# Patient Record
Sex: Female | Born: 1955 | Race: White | Hispanic: No | Marital: Single | Smoking: Never smoker
Health system: Southern US, Community
[De-identification: ages and names within clinical notes are randomized; demographics above are authoritative.]

## PROBLEM LIST (undated history)

## (undated) DIAGNOSIS — N200 Calculus of kidney: Secondary | ICD-10-CM

## (undated) DIAGNOSIS — Z923 Personal history of irradiation: Secondary | ICD-10-CM

## (undated) DIAGNOSIS — E7800 Pure hypercholesterolemia, unspecified: Secondary | ICD-10-CM

## (undated) DIAGNOSIS — I82409 Acute embolism and thrombosis of unspecified deep veins of unspecified lower extremity: Secondary | ICD-10-CM

## (undated) DIAGNOSIS — Z9221 Personal history of antineoplastic chemotherapy: Secondary | ICD-10-CM

## (undated) DIAGNOSIS — I10 Essential (primary) hypertension: Secondary | ICD-10-CM

## (undated) DIAGNOSIS — I2699 Other pulmonary embolism without acute cor pulmonale: Secondary | ICD-10-CM

## (undated) DIAGNOSIS — E119 Type 2 diabetes mellitus without complications: Secondary | ICD-10-CM

## (undated) DIAGNOSIS — C50919 Malignant neoplasm of unspecified site of unspecified female breast: Secondary | ICD-10-CM

## (undated) HISTORY — PX: MASTECTOMY: SHX3

---

## 2003-10-01 HISTORY — PX: BREAST SURGERY: SHX581

## 2005-06-10 ENCOUNTER — Encounter: Admission: RE | Admit: 2005-06-10 | Discharge: 2005-06-10 | Payer: Self-pay | Admitting: Hematology and Oncology

## 2005-06-13 ENCOUNTER — Encounter: Admission: RE | Admit: 2005-06-13 | Discharge: 2005-06-13 | Payer: Self-pay | Admitting: Hematology and Oncology

## 2005-06-13 ENCOUNTER — Encounter (INDEPENDENT_AMBULATORY_CARE_PROVIDER_SITE_OTHER): Payer: Self-pay | Admitting: Specialist

## 2005-12-15 ENCOUNTER — Encounter: Admission: RE | Admit: 2005-12-15 | Discharge: 2005-12-15 | Payer: Self-pay | Admitting: Hematology and Oncology

## 2005-12-19 ENCOUNTER — Encounter: Admission: RE | Admit: 2005-12-19 | Discharge: 2005-12-19 | Payer: Self-pay | Admitting: Hematology and Oncology

## 2006-06-19 ENCOUNTER — Encounter: Admission: RE | Admit: 2006-06-19 | Discharge: 2006-06-19 | Payer: Self-pay | Admitting: Hematology and Oncology

## 2006-06-23 ENCOUNTER — Encounter: Admission: RE | Admit: 2006-06-23 | Discharge: 2006-06-23 | Payer: Self-pay | Admitting: Hematology and Oncology

## 2006-12-11 ENCOUNTER — Encounter: Admission: RE | Admit: 2006-12-11 | Discharge: 2006-12-11 | Payer: Self-pay | Admitting: General Surgery

## 2006-12-30 ENCOUNTER — Encounter (INDEPENDENT_AMBULATORY_CARE_PROVIDER_SITE_OTHER): Payer: Self-pay | Admitting: Specialist

## 2006-12-30 ENCOUNTER — Inpatient Hospital Stay (HOSPITAL_COMMUNITY): Admission: RE | Admit: 2006-12-30 | Discharge: 2007-01-02 | Payer: Self-pay | Admitting: General Surgery

## 2007-02-20 ENCOUNTER — Inpatient Hospital Stay (HOSPITAL_COMMUNITY): Admission: AD | Admit: 2007-02-20 | Discharge: 2007-02-25 | Payer: Self-pay | Admitting: Plastic Surgery

## 2007-12-14 ENCOUNTER — Encounter: Admission: RE | Admit: 2007-12-14 | Discharge: 2007-12-14 | Payer: Self-pay | Admitting: Hematology and Oncology

## 2008-06-13 IMAGING — CR DG CHEST 2V
2 series · 2 of 2 positions shown · non-contrast
Comparison: None.

CLINICAL DATA: Breast cancer.  Preop radiograph.  SOB. 
 CHEST ? 2 VIEW:

[view not recorded (1 of 2)]
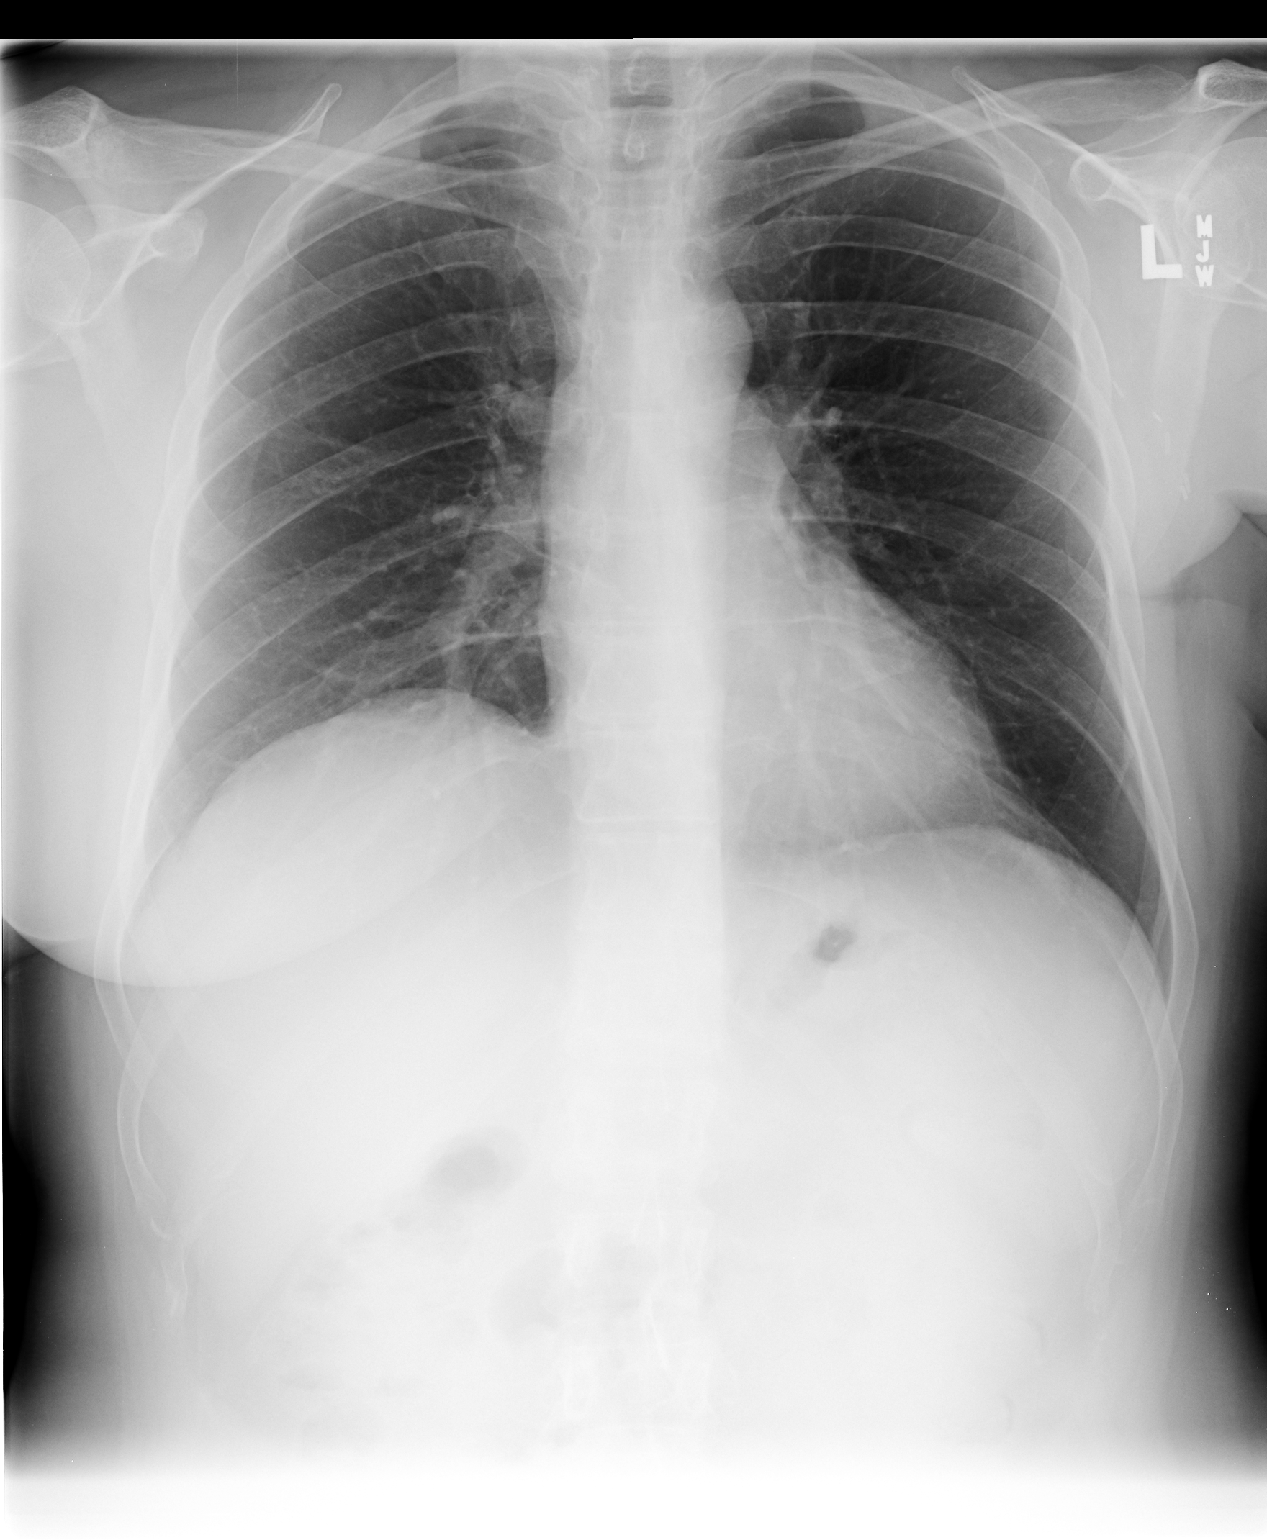

[view not recorded (2 of 2)]
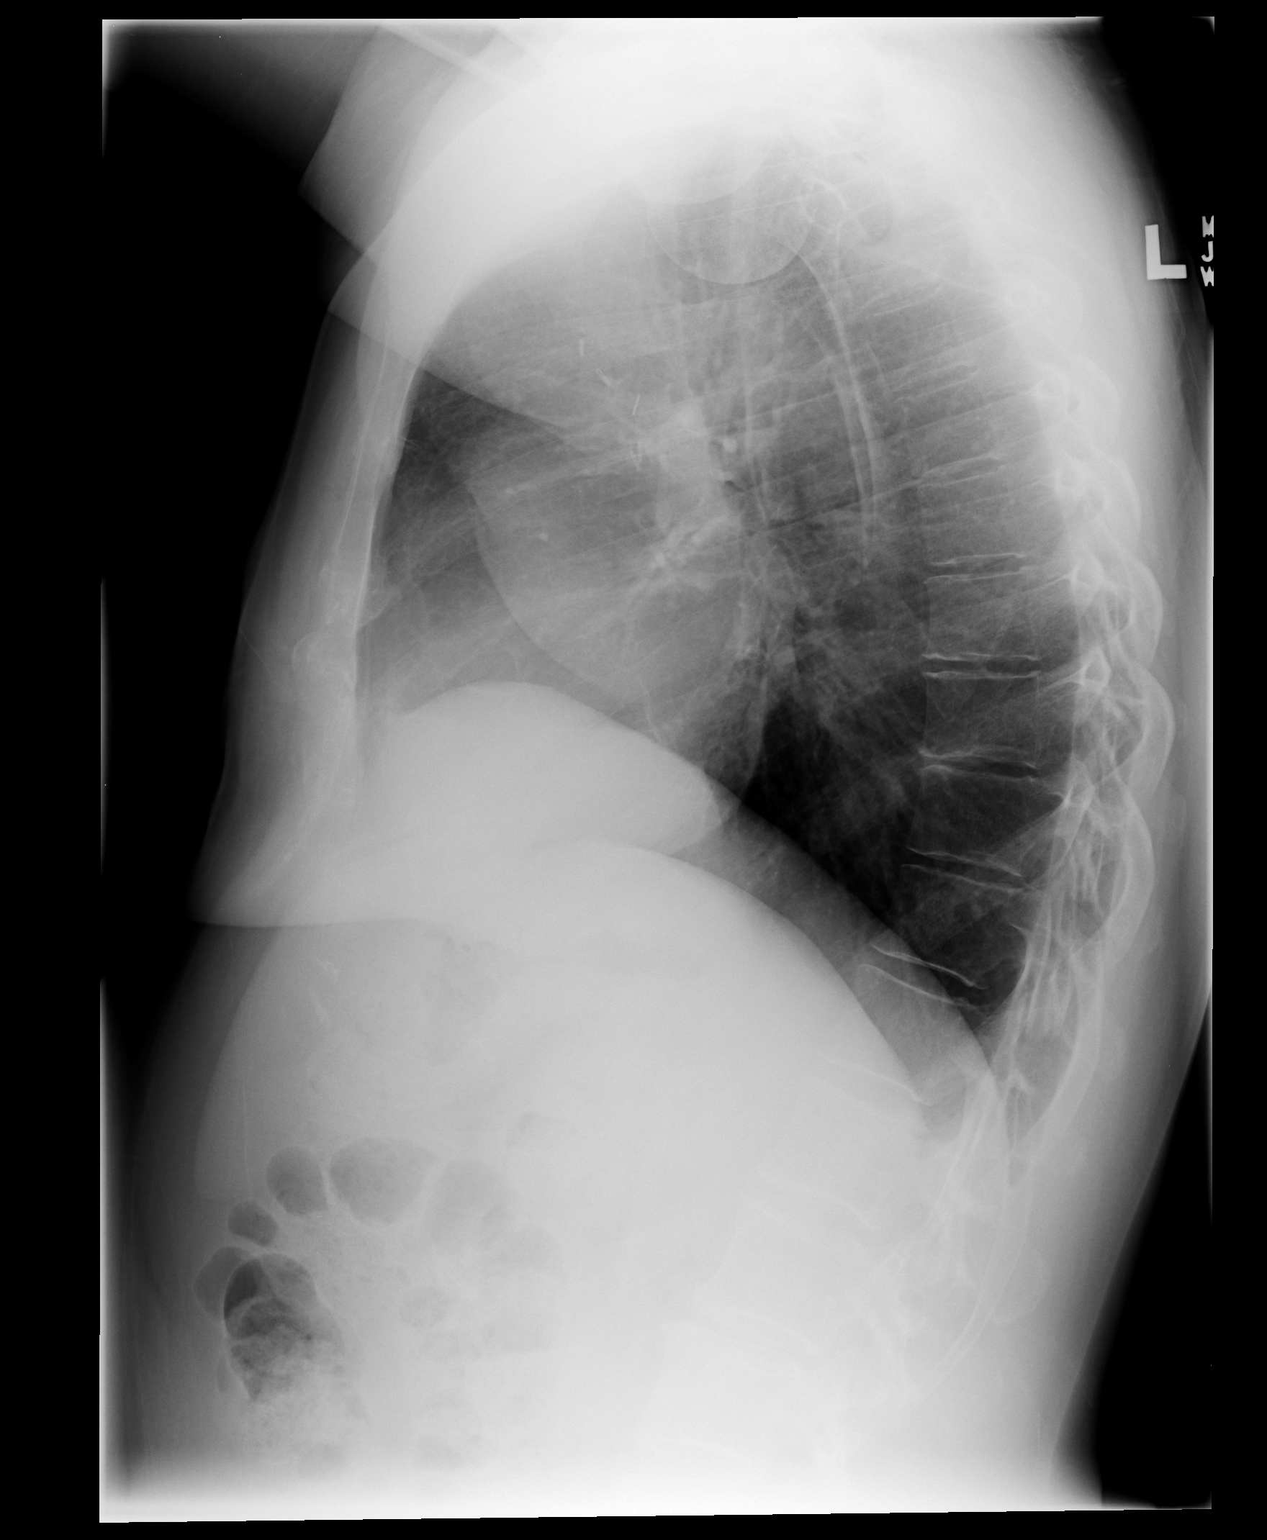

[2 of 2 positions shown; findings below may reference images not displayed]

FINDINGS: Heart size is normal.  There are no effusions or edema.  No airspace opacities are identified.
IMPRESSION: No active disease.

## 2008-08-10 IMAGING — CR DG ABDOMEN 1V
1 series · 1 of 1 positions shown · non-contrast
Comparison: None.

Exam: abdomen, one view.

HISTORY: Redness over abdomen. Status post tram flap 8 weeks ago.

[t abdomen supine]
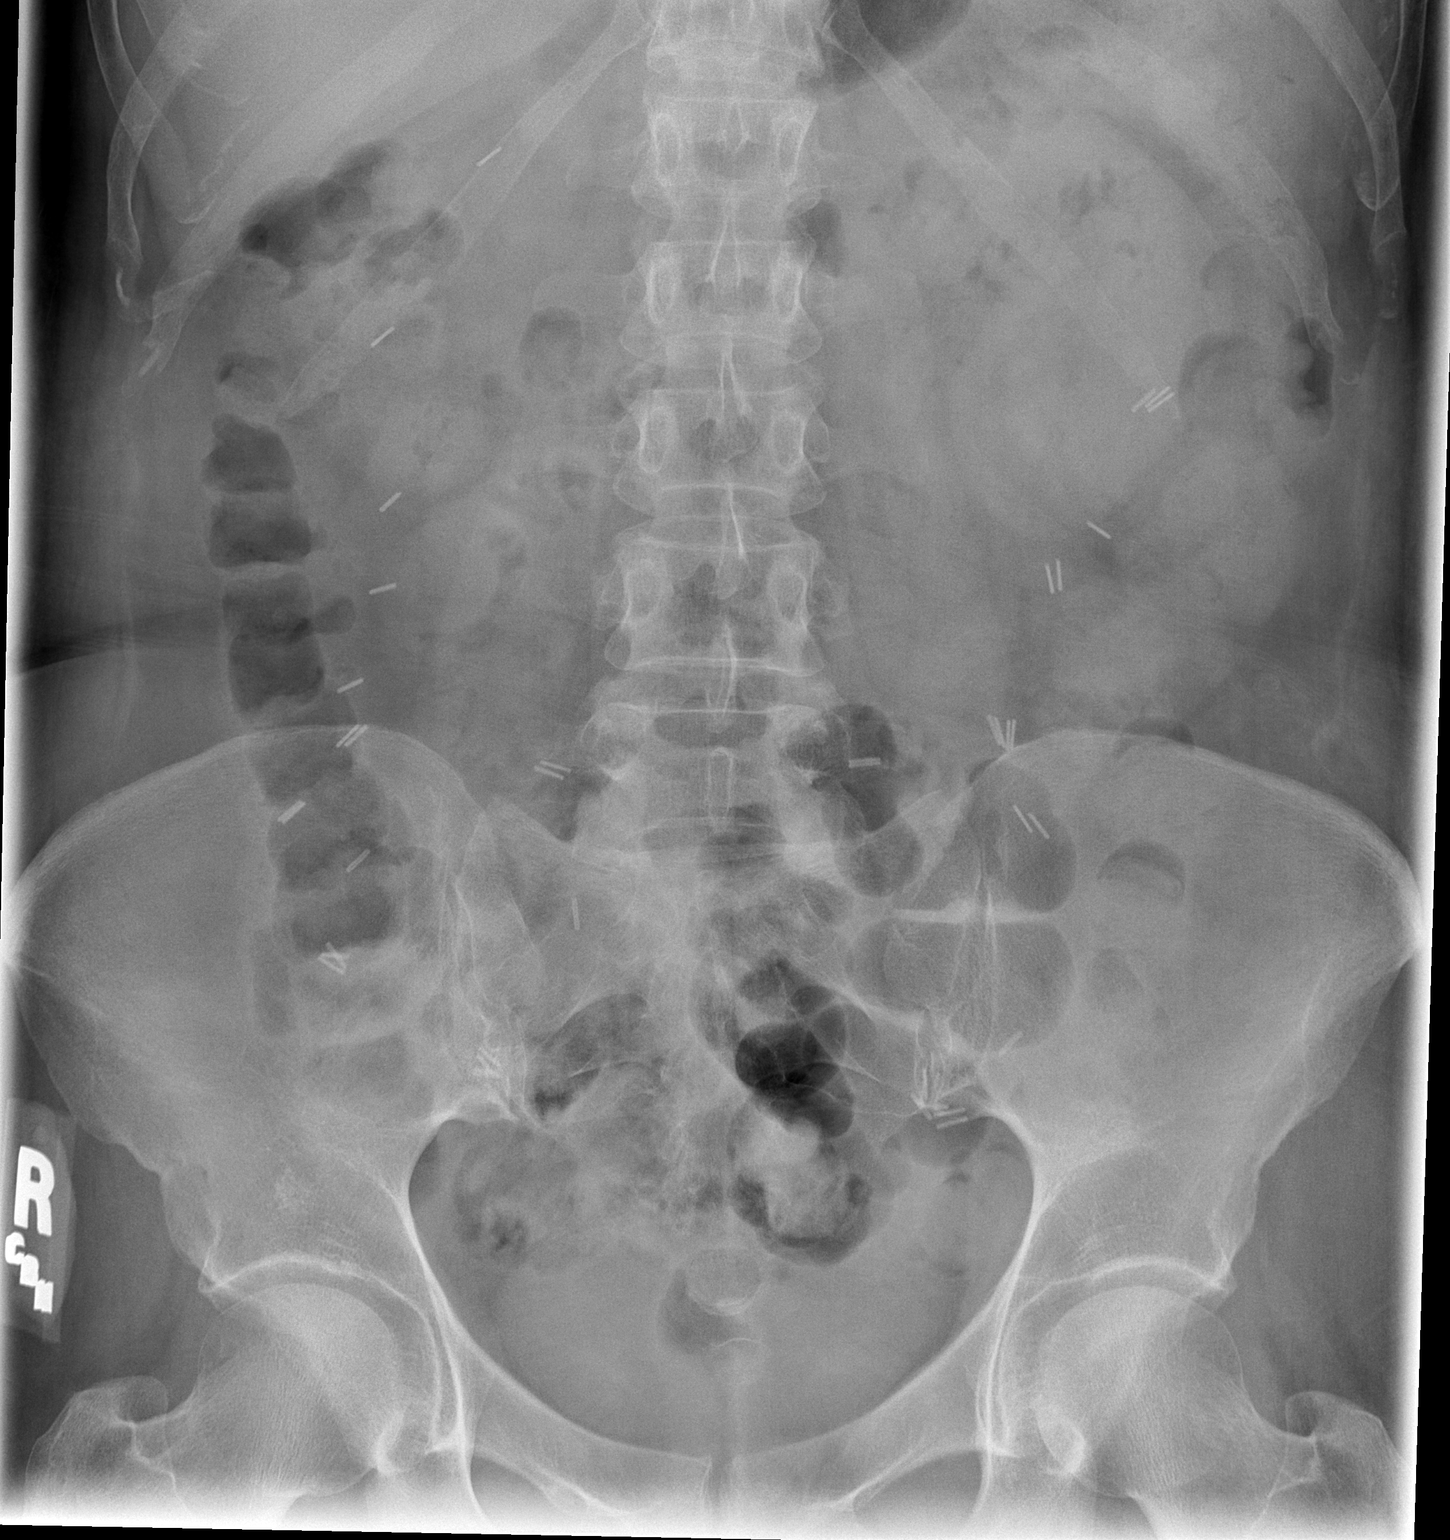

[1 of 1 positions shown; findings below may reference images not displayed]

FINDINGS: Bowel gas pattern is normal. 

There are surgical staples in the projection of both hemiabdomen.

No evidence for bowel obstruction.
IMPRESSION: 1. Normal bowel gas pattern.

## 2008-12-14 ENCOUNTER — Encounter: Admission: RE | Admit: 2008-12-14 | Discharge: 2008-12-14 | Payer: Self-pay | Admitting: Hematology and Oncology

## 2010-01-09 ENCOUNTER — Encounter: Admission: RE | Admit: 2010-01-09 | Discharge: 2010-01-09 | Payer: Self-pay | Admitting: Hematology and Oncology

## 2010-12-10 ENCOUNTER — Other Ambulatory Visit: Payer: Self-pay | Admitting: Hematology and Oncology

## 2010-12-10 DIAGNOSIS — Z853 Personal history of malignant neoplasm of breast: Secondary | ICD-10-CM

## 2010-12-10 DIAGNOSIS — Z1231 Encounter for screening mammogram for malignant neoplasm of breast: Secondary | ICD-10-CM

## 2011-01-24 ENCOUNTER — Ambulatory Visit
Admission: RE | Admit: 2011-01-24 | Discharge: 2011-01-24 | Disposition: A | Discharge status: Home / Self Care | Payer: 59 | Source: Ambulatory Visit | Attending: Hematology and Oncology | Admitting: Hematology and Oncology

## 2011-01-24 DIAGNOSIS — Z853 Personal history of malignant neoplasm of breast: Secondary | ICD-10-CM

## 2011-01-24 DIAGNOSIS — Z1231 Encounter for screening mammogram for malignant neoplasm of breast: Secondary | ICD-10-CM

## 2011-02-12 NOTE — Discharge Summary (Signed)
NAMEMEKLIT, COTTA             ACCOUNT NO.:  [id number]   MEDICAL RECORD NO.:  [id number]          PATIENT TYPE:  INP   LOCATION:  5729                         FACILITY:  [facility]   PHYSICIAN:  Etter Sjogren, M.D.     DATE OF BIRTH:  02-21-1956   DATE OF ADMISSION:  02/20/2007  DATE OF DISCHARGE:  02/25/2007                               DISCHARGE SUMMARY   FINAL DIAGNOSIS:  1. Status post breast cancer with bilaterally breast reconstructions      using TRAM flaps.  2. An episode of cellulitis.   PROCEDURES PERFORMED:  None.   HISTORY:  A 55 year old woman who has had breast cancer, has had  bilateral TRAM flap reconstruction, and did well for about 8 weeks.  Then did have an episode of erythema that responded to antibiotics,  about 6 weeks ago.  She developed a recurrence of that erythema in the  upper abdomen, but it did spread up toward the right breast  reconstruction, and she was hospitalized for treatment.  For further  details of this, please refer to the chart.   COURSE IN THE HOSPITAL:  On admission her white blood cell count was  8300.  Her temperature was 100.9.  That was the maximum temperature.  She immediately deffervesced after admission to the hospital.  Chest x-  ray was normal.  KUB of the abdomen was normal.  Blood cultures were  negative for any growth.  She remained on IV Ancef for 5 days.  Ultrasound to check for free fluid in the subcutaneous space was  negative.  I did not notice any free fluid.  Erythema was completely  resolved.  It was felt that she was ready to be discharged.  She  remained afebrile since her initial admission.   DISPOSITION:  She is discharged on Keflex 500 mg p.o. q.i.d.  She will  continue her other medications as previous.  She continues to have  small, open wounds in the abdomen, and these are treated with saline  dressings.  Told to follow up in the office in 5 days for recheck.      Etter Sjogren, M.D.  Electronically  Signed     DB/MEDQ  D:  02/25/2007  T:  02/26/2007  Job:  [id number]

## 2011-02-15 NOTE — H&P (Signed)
Sherry Schultz, Sherry Schultz             ACCOUNT NO.:  [id number]   MEDICAL RECORD NO.:  [id number]          PATIENT TYPE:  INP   LOCATION:  2899                         FACILITY:  [facility]   PHYSICIAN:  Adolph Pollack, M.D.DATE OF BIRTH:  [birth year]   DATE OF ADMISSION:  12/30/2006  DATE OF DISCHARGE:                              HISTORY & PHYSICAL   REASON:  Prophylactic right mastectomy and bilateral reconstructions.   HISTORY OF PRESENT ILLNESS:  Sherry Schultz is a 55 year old female who  underwent a left modified radical mastectomy for breast cancer.  She  received chemotherapy and radiation therapy.  She has significant risk  factors including a significant family history and nulliparity.  She has  requested prophylactic right mastectomy with bilateral reconstructions  and is admitted for that.   PAST MEDICAL HISTORY:  1. Left breast cancer.  2. DVT and pulmonary embolism.  3. Bacteremia.  4. Hypercholesteremia.  5. Depression.   PREVIOUS OPERATIONS:  1. Left modified radical mastectomy/  2. Right breast biopsy.  3. Port-A-Cath insertion.  4. Port-A-Cath removal.   ALLERGIES:  1. CODEINE.  2. MACRODANTIN.   MEDICATIONS:  Paxil, gabapentin, Lunesta, tamoxifen, Coumadin,  clonidine, Lipitor, Boniva.   SOCIAL HISTORY:  She is single.  No tobacco or alcohol use.   FAMILY HISTORY:  Notable for breast cancer in her mother when she was  premenopausal, as well as pulmonary hypertension.  Father has heart  disease, diabetes, and kidney problems.   REVIEW OF SYSTEMS:  Notable for some intermittent fatigue.   PHYSICAL EXAMINATION:  GENERAL:  A well-developed, well-nourished female  in no acute distress.  Very pleasant and cooperative.  VITAL SIGNS:  Temperature is 98.3, blood pressure is 103/69, pulse 64,  room air saturation 98%.  EYES:  Extraocular motion is intact.  No icterus.  NECK:  Supple, without masses or obvious thyroid enlargement.  CHEST:  There is a left chest  scar, with some radiation changes to the  skin.  Right breast is soft, with a medial scar.  No dominant masses or  suspicious skin changes or nipple discharge.  RESPIRATORY:  Breath sounds equal and clear.  Respirations unlabored.  CARDIOVASCULAR:  Regular rate. regular rhythm.  No murmur heard.  ABDOMEN:  Soft, nontender.  No masses present.  EXTREMITIES:  SCD hose are on.  SKIN:  No jaundice.   IMPRESSION:  Left breast cancer status post chemotherapy, radiation, and  modified radical mastectomy, here for prophylactic right mastectomy and  bilateral reconstructions.   PLAN:  Will proceed with prophylactic right mastectomy, and then Dr.  Odis Luster will proceed with the reconstructions.      Adolph Pollack, M.D.  Electronically Signed     TJR/MEDQ  D:  12/30/2006  T:  12/30/2006  Job:  [id number]

## 2011-02-15 NOTE — Op Note (Signed)
NAMEBRANNA, Sherry Schultz             ACCOUNT NO.:  [id number]   MEDICAL RECORD NO.:  [id number]          PATIENT TYPE:  INP   LOCATION:  2899                         FACILITY:  [facility]   PHYSICIAN:  Adolph Pollack, M.D.DATE OF BIRTH:  12-17-1955   DATE OF PROCEDURE:  12/30/2006  DATE OF DISCHARGE:                               OPERATIVE REPORT   PREOPERATIVE DIAGNOSIS:  Left breast cancer.   POSTOPERATIVE DIAGNOSIS:  Left breast cancer.   PROCEDURE:  Prophylactic right mastectomy.   SURGEON:  Adolph Pollack, M.D.   ANESTHESIA:  General.   INDICATIONS:  This 56 year old female had a left modified radical  mastectomy followed by chemotherapy and radiation therapy for advanced  left breast cancer.  She has significant risk factors and now presents  for prophylactic right mastectomy with Dr. Odis Luster performing  reconstructions following this.   TECHNIQUE:  She is seen in the holding area and the right chest wall  marked with my initials.  She then had marks made on her abdominal wall  and chest wall by Dr. Odis Luster.  She is then brought to the operating  room, placed supine on the operating table and a general anesthetic was  administered.  Foley catheter was inserted.  SCD hose were on.  The  chest, neck and abdominal wall and pelvic area were widely prepped and  draped.  An elliptical skin incision was then made in the right breast  to include the previous scar through the skin and the dermis.  Using  electrocautery,  I then raised skin flaps superiorly to the clavicle,  medial to the sternum, inferiorly to the anterior rectus sheath and  laterally to the isthmus dorsi muscle.  Bleeding was controlled with  electrocautery.  Following this, using electrocautery, I then removed  the breast from the chest wall in the medial to lateral fashion.  Specimen was then passed off the field and weighed.   I then irrigated out the wound and controlled bleeding with  electrocautery.  Once  hemostasis was adequate, I then packed the wound  with warm sponges and covered it with a green towel.  Dr. Odis Luster was to  follow with the reconstructions.  There are no apparent complications  from the right total mastectomy.      Adolph Pollack, M.D.  Electronically Signed     TJR/MEDQ  D:  12/30/2006  T:  12/30/2006  Job:  [id number]   cc:   Nathanial Millman, M.D.

## 2011-02-15 NOTE — Discharge Summary (Signed)
NAMEEIRA, ALPERT             ACCOUNT NO.:  [id number]   MEDICAL RECORD NO.:  [id number]          PATIENT TYPE:  INP   LOCATION:  5706                         FACILITY:  [facility]   PHYSICIAN:  Etter Sjogren, M.D.     DATE OF BIRTH:  Sep 11, 1956   DATE OF ADMISSION:  12/30/2006  DATE OF DISCHARGE:  01/02/2007                               DISCHARGE SUMMARY   FINAL DIAGNOSIS:  History of left breast cancer.   PROCEDURES:  Right mastectomy and bilateral reconstruction with  bilateral transverse rectus stomach myocutaneous flaps.   HISTORY:  A 55 year old woman who has a history of breast cancer that is  fairly aggressive.  She has done well from it.  She did have  chemotherapy and radiation therapy.  She presents for a right mastectomy  and a bilateral reconstruction.  She selected the use of her stomach for  that reconstruction.   The nature of this procedure and the risks were discussed with her, and  she understood all this and wishes to proceed.  Significantly, she had a  past history of a DVT and is on Coumadin.  This was stopped.  She was on  subcutaneous Lovenox at home, and that would be continued while she was  in the hospital.  She understood that she was at increased risk for  recurrent DVT due to her past history.   COURSE IN HOSPITAL:  On admission, her hemoglobin was 14, hematocrit  40.8, platelets 160,000.  Electrolytes were fine.  Urinalysis was okay.  On the day of admission, she underwent surgery.  Right mastectomy and  bilateral TRAM reconstruction was performed.  She tolerated the  procedure well.   Postoperatively, she did well.  Flaps maintained excellent color.  Hemoglobin did drop a little but equilibrated at 10.5.  There was no  evidence of any bleeding.  She was begun on Coumadin and her Lovenox the  day following the surgery.  This has been followed by the pharmacy.  Her  TRAMs are functioning.  Abdomen looks good, a little bit of ecchymosis  down near  the umbilicus, but that has improved gradually while she has  been here in the hospital.  She did develop a headache, but that has  resolved at this point.  Her appetite is good.  She is tolerating a  diet, and she would like to go home.  The pain pump catheters were  removed the day of discharge because the pain pump is now finished.  Her  platelets were a little bit low, but they have also equilibrated around  115,000.   DISPOSITION:  She will follow up with her hematologist/oncologist  regarding PT/PTT and blood counts.  This will be followed up on Monday.  She will continue her same preadmission dose of Coumadin that she was  taking, and in the meantime, she will continue Lovenox 40 mg  subcutaneous everyday as a prophylaxis until the hematologist/oncologist  tells her otherwise.  We will see her back in the office for recheck  next week.  She will witness the drainage three times a day and record  the  amount.  No lifting, no shower, no vigorous activities.   DISCHARGE MEDICATIONS:  1. Percocet 5 mg, a total of 30 given 1 p.o. q.4h. p.r.n. for pain and      the Lovenox 40 mg subcutaneous everyday.  2. Other medications, she will do as she was doing previously.      Etter Sjogren, M.D.  Electronically Signed     DB/MEDQ  D:  01/02/2007  T:  01/03/2007  Job:  [id number]

## 2011-02-15 NOTE — Op Note (Signed)
NAMEAMARIZ, FLAMENCO             ACCOUNT NO.:  [id number]   MEDICAL RECORD NO.:  [id number]          PATIENT TYPE:  INP   LOCATION:  2899                         FACILITY:  [facility]   PHYSICIAN:  Etter Sjogren, M.D.     DATE OF BIRTH:  [birth year]   DATE OF PROCEDURE:  12/30/2006  DATE OF DISCHARGE:                               OPERATIVE REPORT   PREOPERATIVE DIAGNOSIS:  Breast cancer.   POSTOPERATIVE DIAGNOSIS:  Breast cancer.   PROCEDURE PERFORMED:  Bilateral breast reconstruction with transverse  rectus abdominis myocutaneous flaps.   SURGEON:  Etter Sjogren, M.D.   ASSISTANT:  Coralee North, R.N.F.A.   DRAINS:  Two Blake drains for the abdomen, 1 Blake drain for each  breast.   CLINICAL INDICATIONS:  This is a 55 year old who has had breast cancer  on the left had mastectomy with radiation.  She now presents for  mastectomy on the right side and bilateral TRAM reconstruction.  This  has been discussed with her in great detail including the risks plus  complications of her convalescence and she understands those risks and  the overall convalescence and wishes to proceed.   PROCEDURE IN DETAIL:  The patient was marked from full standing position  in the holding area prior to the surgery.  She was marked for  inframammary crease folds as well as the TRAM reconstruction/TRAM flap.  She was taken to the operating room and placed supine, prepped with  Betadine and draped with sterile drapes.  The right mastectomy was  performed by Dr. Avel Peace, no complications.   Attention was directed to the abdomen.  An incision was made around the  umbilicus and the dissection carried down to the subcutaneous tissue  using the scissors.  A generous fatty stalk was left with the umbilicus  to ensure its survival.  The fold scar was removed from the left chest  and the superior and inferior mastectomy flaps were redeveloped down to  the inframammary fold.  The subcutaneous tunnel at the  medial aspect was  begun under direct vision.  A moist lap was placed in this wound.   The upper limb of the incision for the abdomen was then made and that  disappeared into the subcutaneous tissue and then the underlying rectus  fascia was electrocauterized.  Great care was taken to avoid damage to  the underlying rectus fascia.  The dissection was continued cephalad up  to the xiphoid were through and through connections were made to the  right side, the mastectomy was performed on the left side where the  defect had been recreated.  The patient then was placed briefly in a  slightly head back elevated position in order to ensure that the flap  could be removed and then closed as had been planned preoperatively.  This being the case, she returned supine and the lower limb of the  incision was made.  This was carried down to the subcutaneous tissue  using electrocautery.  The underlying rectus fascia was identified.  The  flaps were elevated medial over to the lateral perforators using  electrocautery and the  incision was made in the midline dividing the  right and left flaps.   An incision was made in the rectus fascia, leaving a strip 2.5 cm wide  on the anterior surface of the rectus fascia.  This was carried from the  costal margin down below the flaps.  The careful dissection was  performed dissecting the muscles free from the surrounding fascial  attachments using a bipolar and a scalpel very carefully at the site of  tendinous inscriptions.  The muscle had been freed completely.  The  muscle was divided distally taking great care to preserve the inferior  gastric vessels.  The right and left chest wound was irrigated  thoroughly with saline.  Meticulous hemostasis with electrocautery.  Blake drains were positioned and brought through separate stab wounds  laterally and secured with 3-0 Prolene sutures.  The deeper inferior  gastric vessels were then triple ligated and divided.   The intercostal  perforators laterally had also been ligated with Ligaclips and divided  during the mobilization of the muscle.  The flaps had excellent color.  They were gently passed through the subcutaneous tunnels.  Care had been  taken not to put any torsion or twisting of the muscle pedicle.  The  flaps were brought out onto the chest, temporarily secured with skin  staples.  Again, the flaps had excellent color and bright red bleeding  around the periphery consistent with viability.  The muscle pedicles  checked, no evidence of any torsion or compression or tension.   Attention was directed back to the abdomen.  Thorough irrigation with  saline.  Excellent hemostasis was confirmed.  The fascial closure with 0  Prolene interrupted figure-of-eight sutures with just 2 small areas on  each side just below the umbilicus that could not be closed primarily.  Great care was taken during the closure to avoid damage to underlying  intraabdominal contents.  Thorough irrigation with saline and hemostasis  again confirmed and the only mesh with the hole cut in the center for  the umbilicus went through it was then applied and 0 Prolene sutures  used around the periphery of the fascia, taking care again to avoid any  damage to the underlying intraabdominal contents.  The periphery of the  mesh was secured with a running 2-0 Prolene simple suture.  Again,  thorough irrigation with saline and excellent hemostasis again  confirmed.  The patient was placed in a semi-Fowler position.  The Blake  drains were positioned, one on each side, brought through separate stab  wounds inferiorly and secured with 3-0 Prolene sutures.  The On-Q pain  pump also positioned in the abdomen as well.  The deep fascial closure,  Scarpa's layer with a #1 Quill suture running.  The next deep dermal  layer closed with the Insorb stapling device and the final layer with 2- 0 Quill suture.  Incision was made in the abdomen,  umbilicus brought out  through the opening at midline marked preoperatively and the umbilicus  has excellent color and was viable.  It was then set with 3-0 Monocryl  interrupted inverted deep dermal sutures.   Attention then directed to the chest.  The flaps were inset along the  inferior border using 3-0 Monocryl simple interrupted sutures after  thoroughly irrigating again with saline and meticulous use again with  the electrocautery.  Excellent hemostasis was confirmed.  The 3-0  Monocryl with an interrupted, inverted deep dermal sutures for the  inferior insetting and then the  flap was temporarily positioned and  marked for the de-epithelialization.  The de-epithelialization was  performed and the flap then suspended superiorly using 3-0 Vicryl  interrupted horizontal mattress sutures.  The remainder of the closure  with a 2-0 Quill.  The flaps had excellent color, bright red bleeding  around the peripheral again, consistent with viability, excellent color.  The patient was then dressed with Steri-Strips, dry, sterile dressings  and was transferred to the recovery room stable and tolerated the  procedure well.      Etter Sjogren, M.D.  Electronically Signed     DB/MEDQ  D:  12/30/2006  T:  12/30/2006  Job:  [id number]

## 2012-02-03 ENCOUNTER — Other Ambulatory Visit: Payer: Self-pay | Admitting: Hematology and Oncology

## 2012-02-03 DIAGNOSIS — Z853 Personal history of malignant neoplasm of breast: Secondary | ICD-10-CM

## 2012-02-04 ENCOUNTER — Ambulatory Visit
Admission: RE | Admit: 2012-02-04 | Discharge: 2012-02-04 | Disposition: A | Discharge status: Home / Self Care | Payer: 59 | Source: Ambulatory Visit | Attending: Hematology and Oncology | Admitting: Hematology and Oncology

## 2012-02-04 DIAGNOSIS — Z853 Personal history of malignant neoplasm of breast: Secondary | ICD-10-CM

## 2013-02-18 ENCOUNTER — Other Ambulatory Visit: Payer: Self-pay

## 2013-02-18 DIAGNOSIS — Z1231 Encounter for screening mammogram for malignant neoplasm of breast: Secondary | ICD-10-CM

## 2013-03-04 ENCOUNTER — Ambulatory Visit
Admission: RE | Admit: 2013-03-04 | Discharge: 2013-03-04 | Disposition: A | Discharge status: Home / Self Care | Payer: 59 | Source: Ambulatory Visit

## 2013-03-04 ENCOUNTER — Other Ambulatory Visit: Payer: Self-pay

## 2013-03-04 DIAGNOSIS — Z1231 Encounter for screening mammogram for malignant neoplasm of breast: Secondary | ICD-10-CM

## 2013-08-22 ENCOUNTER — Emergency Department (HOSPITAL_BASED_OUTPATIENT_CLINIC_OR_DEPARTMENT_OTHER): Payer: 59

## 2013-08-22 ENCOUNTER — Other Ambulatory Visit: Payer: Self-pay

## 2013-08-22 ENCOUNTER — Encounter (HOSPITAL_BASED_OUTPATIENT_CLINIC_OR_DEPARTMENT_OTHER): Payer: Self-pay | Admitting: Emergency Medicine

## 2013-08-22 ENCOUNTER — Inpatient Hospital Stay (HOSPITAL_BASED_OUTPATIENT_CLINIC_OR_DEPARTMENT_OTHER)
Admission: EM | Admit: 2013-08-22 | Discharge: 2013-08-24 | DRG: 392 | Disposition: A | Discharge status: Home / Self Care | Payer: 59 | Attending: Internal Medicine | Admitting: Internal Medicine

## 2013-08-22 DIAGNOSIS — M545 Low back pain, unspecified: Secondary | ICD-10-CM | POA: Diagnosis present

## 2013-08-22 DIAGNOSIS — I2782 Chronic pulmonary embolism: Secondary | ICD-10-CM | POA: Diagnosis present

## 2013-08-22 DIAGNOSIS — E785 Hyperlipidemia, unspecified: Secondary | ICD-10-CM | POA: Diagnosis present

## 2013-08-22 DIAGNOSIS — Z7901 Long term (current) use of anticoagulants: Secondary | ICD-10-CM

## 2013-08-22 DIAGNOSIS — I200 Unstable angina: Secondary | ICD-10-CM

## 2013-08-22 DIAGNOSIS — E119 Type 2 diabetes mellitus without complications: Secondary | ICD-10-CM | POA: Diagnosis present

## 2013-08-22 DIAGNOSIS — Z87442 Personal history of urinary calculi: Secondary | ICD-10-CM

## 2013-08-22 DIAGNOSIS — I2510 Atherosclerotic heart disease of native coronary artery without angina pectoris: Secondary | ICD-10-CM | POA: Diagnosis present

## 2013-08-22 DIAGNOSIS — I82509 Chronic embolism and thrombosis of unspecified deep veins of unspecified lower extremity: Secondary | ICD-10-CM | POA: Diagnosis present

## 2013-08-22 DIAGNOSIS — K219 Gastro-esophageal reflux disease without esophagitis: Principal | ICD-10-CM | POA: Diagnosis present

## 2013-08-22 DIAGNOSIS — Z853 Personal history of malignant neoplasm of breast: Secondary | ICD-10-CM

## 2013-08-22 DIAGNOSIS — R0602 Shortness of breath: Secondary | ICD-10-CM | POA: Diagnosis present

## 2013-08-22 DIAGNOSIS — D72829 Elevated white blood cell count, unspecified: Secondary | ICD-10-CM | POA: Diagnosis present

## 2013-08-22 DIAGNOSIS — R079 Chest pain, unspecified: Secondary | ICD-10-CM | POA: Diagnosis present

## 2013-08-22 DIAGNOSIS — I10 Essential (primary) hypertension: Secondary | ICD-10-CM | POA: Diagnosis present

## 2013-08-22 DIAGNOSIS — Z8249 Family history of ischemic heart disease and other diseases of the circulatory system: Secondary | ICD-10-CM

## 2013-08-22 HISTORY — DX: Type 2 diabetes mellitus without complications: E11.9

## 2013-08-22 HISTORY — DX: Calculus of kidney: N20.0

## 2013-08-22 HISTORY — DX: Essential (primary) hypertension: I10

## 2013-08-22 HISTORY — DX: Pure hypercholesterolemia, unspecified: E78.00

## 2013-08-22 LAB — CBC WITH DIFFERENTIAL/PLATELET
Band Neutrophils: 0 % (ref 0–10)
Blasts: 0 %
HCT: 44.8 % (ref 36.0–46.0)
Lymphocytes Relative: 6 % — ABNORMAL LOW (ref 12–46)
Lymphs Abs: 0.8 10*3/uL (ref 0.7–4.0)
MCH: 32.9 pg (ref 26.0–34.0)
MCHC: 33.3 g/dL (ref 30.0–36.0)
MCV: 98.9 fL (ref 78.0–100.0)
Metamyelocytes Relative: 0 %
Monocytes Absolute: 0.5 10*3/uL (ref 0.1–1.0)
Monocytes Relative: 4 % (ref 3–12)
Myelocytes: 0 %
Platelets: 190 10*3/uL (ref 150–400)
RDW: 12.9 % (ref 11.5–15.5)
WBC: 13.2 10*3/uL — ABNORMAL HIGH (ref 4.0–10.5)
nRBC: 0 /100 WBC

## 2013-08-22 LAB — BASIC METABOLIC PANEL
CO2: 26 mEq/L (ref 19–32)
Calcium: 10.3 mg/dL (ref 8.4–10.5)
GFR calc non Af Amer: 60 mL/min — ABNORMAL LOW (ref >90)
Glucose, Bld: 238 mg/dL — ABNORMAL HIGH (ref 70–99)
Potassium: 3.8 mEq/L (ref 3.5–5.1)
Sodium: 138 mEq/L (ref 135–145)

## 2013-08-22 LAB — GLUCOSE, CAPILLARY: Glucose-Capillary: 103 mg/dL — ABNORMAL HIGH (ref 70–99)

## 2013-08-22 LAB — APTT: aPTT: 31 seconds (ref 24–37)

## 2013-08-22 LAB — TROPONIN I
Troponin I: <0.3 ng/mL (ref <0.30)
Troponin I: <0.3 ng/mL (ref <0.30)

## 2013-08-22 LAB — PROTIME-INR
INR: 1.87 — ABNORMAL HIGH (ref 0.00–1.49)
Prothrombin Time: 21 seconds — ABNORMAL HIGH (ref 11.6–15.2)

## 2013-08-22 MED ORDER — ASPIRIN 81 MG PO CHEW
324.0000 mg | CHEWABLE_TABLET | Freq: Once | ORAL | Status: AC
  Administered 2013-08-22: 324 mg via ORAL

## 2013-08-22 MED ORDER — MORPHINE SULFATE 2 MG/ML IJ SOLN
2.0000 mg | Freq: Once | INTRAMUSCULAR | Status: AC
  Administered 2013-08-22: 2 mg via INTRAVENOUS

## 2013-08-22 MED ORDER — HEPARIN BOLUS VIA INFUSION
4000.0000 [IU] | Freq: Once | INTRAVENOUS | Status: AC
  Administered 2013-08-22: 4000 [IU] via INTRAVENOUS

## 2013-08-22 MED ORDER — ACETAMINOPHEN 325 MG PO TABS
650.0000 mg | ORAL_TABLET | ORAL | Status: DC | PRN
  Administered 2013-08-22 – 2013-08-23 (×2): 650 mg via ORAL
  Filled 2013-08-22 (×3): qty 2

## 2013-08-22 MED ORDER — METOPROLOL TARTRATE 12.5 MG HALF TABLET
12.5000 mg | ORAL_TABLET | Freq: Two times a day (BID) | ORAL | Status: DC
  Administered 2013-08-22 – 2013-08-23 (×3): 12.5 mg via ORAL
  Filled 2013-08-22 (×6): qty 1

## 2013-08-22 MED ORDER — LOSARTAN POTASSIUM 50 MG PO TABS
50.0000 mg | ORAL_TABLET | Freq: Every day | ORAL | Status: DC
  Filled 2013-08-22: qty 1

## 2013-08-22 MED ORDER — CALCIUM CARBONATE 1250 (500 CA) MG PO CAPS: 1250.0000 mg | ORAL_CAPSULE | Freq: Two times a day (BID) | ORAL | Status: DC

## 2013-08-22 MED ORDER — NITROGLYCERIN IN D5W 200-5 MCG/ML-% IV SOLN
5.0000 ug/min | INTRAVENOUS | Status: DC
  Administered 2013-08-22: 5 ug/min via INTRAVENOUS
  Filled 2013-08-22: qty 250

## 2013-08-22 MED ORDER — ATORVASTATIN CALCIUM 80 MG PO TABS
80.0000 mg | ORAL_TABLET | Freq: Every day | ORAL | Status: DC
  Administered 2013-08-23: 80 mg via ORAL
  Filled 2013-08-22 (×2): qty 1

## 2013-08-22 MED ORDER — PAROXETINE HCL 20 MG PO TABS
20.0000 mg | ORAL_TABLET | Freq: Every day | ORAL | Status: DC
  Administered 2013-08-23: 20 mg via ORAL
  Filled 2013-08-22 (×3): qty 1

## 2013-08-22 MED ORDER — ONDANSETRON HCL 4 MG/2ML IJ SOLN: 4.0000 mg | Freq: Four times a day (QID) | INTRAMUSCULAR | Status: DC | PRN

## 2013-08-22 MED ORDER — ASPIRIN 81 MG PO CHEW: 324.0000 mg | CHEWABLE_TABLET | Freq: Once | ORAL | Status: AC

## 2013-08-22 MED ORDER — IOHEXOL 350 MG/ML SOLN
100.0000 mL | Freq: Once | INTRAVENOUS | Status: AC | PRN
  Administered 2013-08-22: 100 mL via INTRAVENOUS

## 2013-08-22 MED ORDER — NITROGLYCERIN 0.4 MG SL SUBL: 0.4000 mg | SUBLINGUAL_TABLET | SUBLINGUAL | Status: DC | PRN

## 2013-08-22 MED ORDER — HEPARIN (PORCINE) IN NACL 100-0.45 UNIT/ML-% IJ SOLN
900.0000 [IU]/h | INTRAMUSCULAR | Status: DC
  Administered 2013-08-22: 1000 [IU]/h via INTRAVENOUS
  Administered 2013-08-23: 900 [IU]/h via INTRAVENOUS
  Filled 2013-08-22 (×2): qty 250

## 2013-08-22 MED ORDER — CALCIUM CARBONATE 1250 (500 CA) MG PO TABS
1250.0000 mg | ORAL_TABLET | Freq: Two times a day (BID) | ORAL | Status: DC
  Administered 2013-08-23 (×2): 1250 mg via ORAL
  Filled 2013-08-22 (×6): qty 1

## 2013-08-22 MED ORDER — NITROGLYCERIN 2 % TD OINT
TOPICAL_OINTMENT | TRANSDERMAL | Status: AC
  Filled 2013-08-22: qty 1

## 2013-08-22 MED ORDER — NITROGLYCERIN 0.4 MG SL SUBL
0.4000 mg | SUBLINGUAL_TABLET | SUBLINGUAL | Status: AC | PRN
  Administered 2013-08-22 (×3): 0.4 mg via SUBLINGUAL
  Filled 2013-08-22 (×2): qty 25

## 2013-08-22 MED ORDER — ASPIRIN EC 81 MG PO TBEC
81.0000 mg | DELAYED_RELEASE_TABLET | Freq: Every day | ORAL | Status: DC
  Administered 2013-08-23: 81 mg via ORAL
  Filled 2013-08-22: qty 1

## 2013-08-22 MED ORDER — NITROGLYCERIN 2 % TD OINT: 1.0000 [in_us] | TOPICAL_OINTMENT | Freq: Once | TRANSDERMAL | Status: DC

## 2013-08-22 MED ORDER — ASPIRIN 81 MG PO CHEW
CHEWABLE_TABLET | ORAL | Status: AC
  Filled 2013-08-22: qty 4

## 2013-08-22 MED ORDER — SODIUM CHLORIDE 0.9 % IV SOLN
INTRAVENOUS | Status: DC
  Administered 2013-08-22: 1000 mL via INTRAVENOUS

## 2013-08-22 MED ORDER — MORPHINE SULFATE 2 MG/ML IJ SOLN
INTRAMUSCULAR | Status: AC
  Filled 2013-08-22: qty 1

## 2013-08-22 NOTE — ED Notes (Signed)
Nitro increased to 8 mcg/min with complete pain relief.

## 2013-08-22 NOTE — Progress Notes (Signed)
ANTICOAGULATION CONSULT NOTE - Initial Consult  Pharmacy Consult for heparin  Indication: chest pain and hx of PE  Allergies  Allergen Reactions  . Codeine Rash    Patient Measurements: Weight: 170 lb (77.111 kg) Heparin Dosing Weight:   Vital Signs: Temp: 99 F (37.2 C) (11/23 1525) Temp src: Oral (11/23 1525) BP: 101/68 mmHg (11/23 1942) Pulse Rate: 67 (11/23 1942)  Labs:  Recent Labs  08/22/13 1541 08/22/13 2000  HGB 14.9  --   HCT 44.8  --   PLT 190  --   APTT 31  --   LABPROT 21.0*  --   INR 1.87*  --   CREATININE 1.00  --   TROPONINI <0.30 <0.30    CrCl is unknown because there is no height on file for the current visit.   Medical History: Past Medical History  Diagnosis Date  . Diabetes mellitus without complication   . Hypertension   . High cholesterol   . Kidney stone     Medications:  Scheduled:   Infusions:  . heparin 1,000 Units/hr (08/22/13 1816)  . nitroGLYCERIN 8 mcg/min (08/22/13 2050)    Assessment: 57 yo female with chest pain and history of PE will be continued on heparin.  Patient received a 4000 units heparin bolus at [facility] and also started on a heparin drip at 1000 units/hr at 1816.  Hgb 14.9 and Plt 190 K and INR 1.87.  Patient was on coumadin 7.5mg  po daily prior to admission. Goal of Therapy:  Heparin level 0.3-0.7 units/ml Monitor platelets by anticoagulation protocol: Yes   Plan:  1) Continue heparin at 1000 units/hr 2) Check a heparin level at 0030 on 08/23/13 3) Daily heparin level and CBC  Yasmin Dibello, Tsz-Yin 08/22/2013,8:55 PM

## 2013-08-22 NOTE — ED Notes (Signed)
Pain in chest resolved after SL nitro.

## 2013-08-22 NOTE — H&P (Signed)
Sherry Schultz is an 57 y.o. female.   Chief Complaint: chest pain HPI: Sherry Schultz is a 57 yo woman with PMH of breast cancer '05 s/p radical mastectomy, T2DM on metformin, dyslipidemia, prior DVT/PE, hypertension who presents with substernal chest pain that began last night that has lasted intermittently for dozens of minutes that worsened today leading to presentation. She characterizes the pain as heavy and tight with radiation to her back between her shoulder blades. 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 Her daily warfarin dosing is 7.5 mg qHS. Her initial ECG was noted to have inferior ST depression with CP at that time that improved with SL NTG. She had CP again requiring NTG leading to the ER team to start heparin and NTG gtt for unstable angina. She had a negative CTA for PE in the ER and transfer to [facility] arrange for stepdown and ongoing treatment for unstable angina. She tells me she became painfree with the SL NTG and the gtt in the ER but she had some more pain during my examination that resolved with low dose morphine. Otherwise, she's been doing well at home without exercise limitations as recently as a few days ago - no PND, no orthopnea, can walk up multiple flights of stairs. She is accompanied by her sister an ICU nurse and we discussed plan with likely LHC tomorrow.   Past Medical History  Diagnosis Date  . Diabetes mellitus without complication   . Hypertension   . High cholesterol   . Kidney stone     Past Surgical History  Procedure Laterality Date  . Breast surgery      History reviewed. No pertinent family history. Social History:  reports that she has never smoked. She does not have any smokeless tobacco history on file. She reports that she does not drink alcohol or use illicit drugs. Father with CAD/CABG Mother with COPD/PHTN  Allergies:  Allergies  Allergen Reactions  .  Codeine Rash    Medications Prior to Admission  Medication Sig Dispense Refill  . atorvastatin (LIPITOR) 20 MG tablet Take 20 mg by mouth daily.      . Calcium Carbonate-Vit D-Min (CALCIUM 1200 PO) Take 1,200 mg by mouth 2 (two) times daily.      Marland Kitchen HYDROcodone-acetaminophen (NORCO/VICODIN) 5-325 MG per tablet Take 1 tablet by mouth every 6 (six) hours as needed for moderate pain.       Marland Kitchen ibandronate (BONIVA) 150 MG tablet Take 150 mg by mouth every 30 (thirty) days.       Marland Kitchen losartan (COZAAR) 50 MG tablet Take 50 mg by mouth daily.      . metFORMIN (GLUCOPHAGE) 500 MG tablet Take 500 mg by mouth 2 (two) times daily with a meal.      . Multiple Vitamin (MULTIVITAMIN WITH MINERALS) TABS tablet Take 1 tablet by mouth daily.      Marland Kitchen PARoxetine (PAXIL) 20 MG tablet Take 20 mg by mouth daily.      Marland Kitchen warfarin (COUMADIN) 7.5 MG tablet Take 7.5 mg by mouth every evening.        Results for orders placed during the hospital encounter of 08/22/13 (from the past 48 hour(s))  CBC WITH DIFFERENTIAL     Status: Abnormal   Collection Time    08/22/13  3:41 PM      Result Value Range   WBC 13.2 (*) 4.0 - 10.5  K/uL   RBC 4.53  3.87 - 5.11 MIL/uL   Hemoglobin 14.9  12.0 - 15.0 g/dL   HCT 16.1  09.6 - 04.5 %   MCV 98.9  78.0 - 100.0 fL   MCH 32.9  26.0 - 34.0 pg   MCHC 33.3  30.0 - 36.0 g/dL   RDW 40.9  81.1 - 91.4 %   Platelets 190  150 - 400 K/uL   Neutrophils Relative % 85 (*) 43 - 77 %   Lymphocytes Relative 6 (*) 12 - 46 %   Monocytes Relative 4  3 - 12 %   Eosinophils Relative 3  0 - 5 %   Basophils Relative 2 (*) 0 - 1 %   Band Neutrophils 0  0 - 10 %   Metamyelocytes Relative 0     Myelocytes 0     Promyelocytes Absolute 0     Blasts 0     nRBC 0  0 /100 WBC   Neutro Abs 11.2 (*) 1.7 - 7.7 K/uL   Lymphs Abs 0.8  0.7 - 4.0 K/uL   Monocytes Absolute 0.5  0.1 - 1.0 K/uL   Eosinophils Absolute 0.4  0.0 - 0.7 K/uL   Basophils Absolute 0.3 (*) 0.0 - 0.1 K/uL   WBC Morphology ATYPICAL  LYMPHOCYTES     Comment: VACUOLATED NEUTROPHILS  BASIC METABOLIC PANEL     Status: Abnormal   Collection Time    08/22/13  3:41 PM      Result Value Range   Sodium 138  135 - 145 mEq/L   Potassium 3.8  3.5 - 5.1 mEq/L   Chloride 97  96 - 112 mEq/L   CO2 26  19 - 32 mEq/L   Glucose, Bld 238 (*) 70 - 99 mg/dL   BUN 27 (*) 6 - 23 mg/dL   Creatinine, Ser 7.82  0.50 - 1.10 mg/dL   Calcium 95.6  8.4 - 21.3 mg/dL   GFR calc non Af Amer 60 (*) >90 mL/min   GFR calc Af Amer 70 (*) >90 mL/min   Comment: (NOTE)     The eGFR has been calculated using the CKD EPI equation.     This calculation has not been validated in all clinical situations.     eGFR's persistently <90 mL/min signify possible Chronic Kidney     Disease.  PROTIME-INR     Status: Abnormal   Collection Time    08/22/13  3:41 PM      Result Value Range   Prothrombin Time 21.0 (*) 11.6 - 15.2 seconds   INR 1.87 (*) 0.00 - 1.49  APTT     Status: None   Collection Time    08/22/13  3:41 PM      Result Value Range   aPTT 31  24 - 37 seconds  TROPONIN I     Status: None   Collection Time    08/22/13  3:41 PM      Result Value Range   Troponin I <0.30  <0.30 ng/mL   Comment:            Due to the release kinetics of cTnI,     a negative result within the first hours     of the onset of symptoms does not rule out     myocardial infarction with certainty.     If myocardial infarction is still suspected,     repeat the test at appropriate intervals.  GLUCOSE, CAPILLARY  Status: Abnormal   Collection Time    08/22/13  4:57 PM      Result Value Range   Glucose-Capillary 135 (*) 70 - 99 mg/dL   Comment 1 Notify RN     Comment 2 Documented in Chart    TROPONIN I     Status: None   Collection Time    08/22/13  8:00 PM      Result Value Range   Troponin I <0.30  <0.30 ng/mL   Comment:            Due to the release kinetics of cTnI,     a negative result within the first hours     of the onset of symptoms does not rule  out     myocardial infarction with certainty.     If myocardial infarction is still suspected,     repeat the test at appropriate intervals.  GLUCOSE, CAPILLARY     Status: Abnormal   Collection Time    08/22/13  9:01 PM      Result Value Range   Glucose-Capillary 103 (*) 70 - 99 mg/dL  MRSA PCR SCREENING     Status: None   Collection Time    08/22/13  9:02 PM      Result Value Range   MRSA by PCR NEGATIVE  NEGATIVE   Comment:            The GeneXpert MRSA Assay (FDA     approved for NASAL specimens     only), is one component of a     comprehensive MRSA colonization     surveillance program. It is not     intended to diagnose MRSA     infection nor to guide or     monitor treatment for     MRSA infections.  TROPONIN I     Status: None   Collection Time    08/22/13  9:32 PM      Result Value Range   Troponin I <0.30  <0.30 ng/mL   Comment:            Due to the release kinetics of cTnI,     a negative result within the first hours     of the onset of symptoms does not rule out     myocardial infarction with certainty.     If myocardial infarction is still suspected,     repeat the test at appropriate intervals.  PRO B NATRIURETIC PEPTIDE     Status: None   Collection Time    08/22/13  9:32 PM      Result Value Range   Pro B Natriuretic peptide (BNP) 46.6  0 - 125 pg/mL   Ct Angio Chest Pe W/cm &/or Wo Cm  08/22/2013   CLINICAL DATA:  Constant chest pain with shortness of breath. Pain radiates to the back and increases with activity.  EXAM: CT ANGIOGRAPHY CHEST, ABDOMEN AND PELVIS  TECHNIQUE: Multidetector CT imaging through the chest, abdomen and pelvis was performed using the standard protocol during bolus administration of intravenous contrast. Multiplanar reconstructed images including MIPs were obtained and reviewed to evaluate the vascular anatomy.  CONTRAST:  OMNIPAQUE IOHEXOL 350 MG/ML SOLN  COMPARISON:  Abdominal pelvic CT 09/24/2011.  FINDINGS: CTA CHEST  FINDINGS  Pre contrast images demonstrate mild left subclavian and aortic atherosclerosis. No displaced intimal calcifications are identified. Post-contrast, the aorta opacifies normally. There is no evidence of aneurysm or dissection. The pulmonary arteries are  well opacified with contrast. There is no evidence of acute pulmonary embolism.  There is a 7 mm low-density lesion in the right thyroid lobe on image 16. No enlarged mediastinal or hilar lymph nodes are demonstrated. There is no significant pleural or pericardial effusion.  The lungs are clear. There is evidence of bilateral breast reconstruction. No worrisome osseous findings are demonstrated.  Review of the MIP images confirms the above findings.  CTA ABDOMEN AND PELVIS FINDINGS  The abdominal aorta is normal in caliber. There is no evidence of aneurysm or dissection. There are single renal arteries bilaterally which are widely patent. The celiac trunk, superior mesenteric artery and inferior mesenteric artery appear normal. Mild iliac atherosclerosis is present bilaterally.  The liver demonstrates mild steatosis, but no focal abnormality. The spleen, gallbladder, pancreas and adrenal glands appear unremarkable. Small low density renal lesions are noted bilaterally, not grossly changed. There is no hydronephrosis or recurrent perinephric soft tissue stranding.  Abdominal surgical clips are noted. The uterus is surgically absent. The appendix appears normal. There are no inflammatory changes. Both ovaries are visualized and appear unchanged status post partial hysterectomy. The bladder appears normal.  There are no acute or worrisome osseous findings.  Review of the MIP images confirms the above findings.  IMPRESSION: 1. CTA demonstrates no aneurysm, dissection or thromboembolic disease. Scattered mild atherosclerosis is noted. 2. Small low-density right thyroid lesion, likely incidental. 3. Grossly stable small low density renal lesions, likely cysts. No  recurrent ureteral obstruction. 4. Postsurgical changes as described.   Electronically Signed   By: Roxy Horseman M.D.   On: 08/22/2013 17:59   Ct Angio Pelvis W/cm &/or Wo/cm  08/22/2013   CLINICAL DATA:  Constant chest pain with shortness of breath. Pain radiates to the back and increases with activity.  EXAM: CT ANGIOGRAPHY CHEST, ABDOMEN AND PELVIS  TECHNIQUE: Multidetector CT imaging through the chest, abdomen and pelvis was performed using the standard protocol during bolus administration of intravenous contrast. Multiplanar reconstructed images including MIPs were obtained and reviewed to evaluate the vascular anatomy.  CONTRAST:  OMNIPAQUE IOHEXOL 350 MG/ML SOLN  COMPARISON:  Abdominal pelvic CT 09/24/2011.  FINDINGS: CTA CHEST FINDINGS  Pre contrast images demonstrate mild left subclavian and aortic atherosclerosis. No displaced intimal calcifications are identified. Post-contrast, the aorta opacifies normally. There is no evidence of aneurysm or dissection. The pulmonary arteries are well opacified with contrast. There is no evidence of acute pulmonary embolism.  There is a 7 mm low-density lesion in the right thyroid lobe on image 16. No enlarged mediastinal or hilar lymph nodes are demonstrated. There is no significant pleural or pericardial effusion.  The lungs are clear. There is evidence of bilateral breast reconstruction. No worrisome osseous findings are demonstrated.  Review of the MIP images confirms the above findings.  CTA ABDOMEN AND PELVIS FINDINGS  The abdominal aorta is normal in caliber. There is no evidence of aneurysm or dissection. There are single renal arteries bilaterally which are widely patent. The celiac trunk, superior mesenteric artery and inferior mesenteric artery appear normal. Mild iliac atherosclerosis is present bilaterally.  The liver demonstrates mild steatosis, but no focal abnormality. The spleen, gallbladder, pancreas and adrenal glands appear unremarkable.  Small low density renal lesions are noted bilaterally, not grossly changed. There is no hydronephrosis or recurrent perinephric soft tissue stranding.  Abdominal surgical clips are noted. The uterus is surgically absent. The appendix appears normal. There are no inflammatory changes. Both ovaries are visualized and appear unchanged status  post partial hysterectomy. The bladder appears normal.  There are no acute or worrisome osseous findings.  Review of the MIP images confirms the above findings.  IMPRESSION: 1. CTA demonstrates no aneurysm, dissection or thromboembolic disease. Scattered mild atherosclerosis is noted. 2. Small low-density right thyroid lesion, likely incidental. 3. Grossly stable small low density renal lesions, likely cysts. No recurrent ureteral obstruction. 4. Postsurgical changes as described.   Electronically Signed   By: Roxy Horseman M.D.   On: 08/22/2013 17:59   Ct Angio Abdomen W/cm &/or Wo Contrast  08/22/2013   CLINICAL DATA:  Constant chest pain with shortness of breath. Pain radiates to the back and increases with activity.  EXAM: CT ANGIOGRAPHY CHEST, ABDOMEN AND PELVIS  TECHNIQUE: Multidetector CT imaging through the chest, abdomen and pelvis was performed using the standard protocol during bolus administration of intravenous contrast. Multiplanar reconstructed images including MIPs were obtained and reviewed to evaluate the vascular anatomy.  CONTRAST:  OMNIPAQUE IOHEXOL 350 MG/ML SOLN  COMPARISON:  Abdominal pelvic CT 09/24/2011.  FINDINGS: CTA CHEST FINDINGS  Pre contrast images demonstrate mild left subclavian and aortic atherosclerosis. No displaced intimal calcifications are identified. Post-contrast, the aorta opacifies normally. There is no evidence of aneurysm or dissection. The pulmonary arteries are well opacified with contrast. There is no evidence of acute pulmonary embolism.  There is a 7 mm low-density lesion in the right thyroid lobe on image 16. No enlarged  mediastinal or hilar lymph nodes are demonstrated. There is no significant pleural or pericardial effusion.  The lungs are clear. There is evidence of bilateral breast reconstruction. No worrisome osseous findings are demonstrated.  Review of the MIP images confirms the above findings.  CTA ABDOMEN AND PELVIS FINDINGS  The abdominal aorta is normal in caliber. There is no evidence of aneurysm or dissection. There are single renal arteries bilaterally which are widely patent. The celiac trunk, superior mesenteric artery and inferior mesenteric artery appear normal. Mild iliac atherosclerosis is present bilaterally.  The liver demonstrates mild steatosis, but no focal abnormality. The spleen, gallbladder, pancreas and adrenal glands appear unremarkable. Small low density renal lesions are noted bilaterally, not grossly changed. There is no hydronephrosis or recurrent perinephric soft tissue stranding.  Abdominal surgical clips are noted. The uterus is surgically absent. The appendix appears normal. There are no inflammatory changes. Both ovaries are visualized and appear unchanged status post partial hysterectomy. The bladder appears normal.  There are no acute or worrisome osseous findings.  Review of the MIP images confirms the above findings.  IMPRESSION: 1. CTA demonstrates no aneurysm, dissection or thromboembolic disease. Scattered mild atherosclerosis is noted. 2. Small low-density right thyroid lesion, likely incidental. 3. Grossly stable small low density renal lesions, likely cysts. No recurrent ureteral obstruction. 4. Postsurgical changes as described.   Electronically Signed   By: Roxy Horseman M.D.   On: 08/22/2013 17:59    Review of Systems  Constitutional: Negative for fever, chills and weight loss.  HENT: Negative for hearing loss and tinnitus.   Eyes: Negative for blurred vision, double vision and photophobia.  Respiratory: Positive for shortness of breath. Negative for hemoptysis and sputum  production.        Stable SOB   Cardiovascular: Positive for chest pain. Negative for palpitations, orthopnea and claudication.  Gastrointestinal: Negative for heartburn, nausea and vomiting.  Genitourinary: Negative for dysuria, urgency and frequency.  Musculoskeletal: Positive for back pain. Negative for neck pain.  Skin: Negative for rash.  Neurological: Negative for dizziness,  tremors, sensory change and headaches.  Endo/Heme/Allergies: Negative for environmental allergies. Does not bruise/bleed easily.  Psychiatric/Behavioral: Negative for depression, suicidal ideas and substance abuse.    Blood pressure 109/66, pulse 54, temperature 98.1 F (36.7 C), temperature source Oral, resp. rate 14, height 5\' 5"  (1.651 m), weight 81.1 kg (178 lb 12.7 oz), SpO2 99.00%. Physical Exam  Nursing note and vitals reviewed. Constitutional: She is oriented to person, place, and time. She appears well-developed and well-nourished. No distress.  HENT:  Head: Normocephalic and atraumatic.  Nose: Nose normal.  Mouth/Throat: Oropharynx is clear and moist. No oropharyngeal exudate.  Eyes: Conjunctivae and EOM are normal. Pupils are equal, round, and reactive to light. No scleral icterus.  Neck: Normal range of motion. Neck supple. No JVD present. No tracheal deviation present. No thyromegaly present.  Cardiovascular: Normal rate, regular rhythm, normal heart sounds and intact distal pulses.  Exam reveals no gallop and no friction rub.   No murmur heard. Respiratory: Effort normal and breath sounds normal. No respiratory distress. She has no wheezes. She has no rales.  GI: Soft. Bowel sounds are normal. She exhibits no distension. There is no tenderness.  Musculoskeletal: Normal range of motion. She exhibits no edema and no tenderness.  Neurological: She is alert and oriented to person, place, and time. She has normal reflexes. No cranial nerve deficit.  Skin: Skin is warm and dry. No rash noted. She is not  diaphoretic. No erythema.  Psychiatric: Her behavior is normal. Thought content normal.    Labs reviewed, chart reviewed; notable for troponin < 0.3, INR 1.9, wbc 13.2, cr 1.0 EKG initially HR 90s, inferior ST depressions, nl axis; repeat ECG with improved ST depressions  Problem list Chest Pain Unstable Angina Dyslipidemia Hypertension Leukocytosis Prior Breast cancer T2DM on metformin  Assessment/Plan 57 yo woman with previous history of breast cancer '05, hypertension, T2DM, dyslipidemia who presents with chest pain concerning for unstable angina. Differential diagnosis includes musculoskeletal pain, pulmonary embolism, neuropathic pain, GERD, esophageal spasm among others. However, her pain sounds angina with substernal pain, central, radiating to the back with improvement with SL NTG. She's currently on heparin gtt, NTG gtt and given large aspirin. Will plan for ikely LHC in AM.  - admit to telemetry/stepdown  - TSH, BNP, Mg, hba1c, lipid panel - holding metformin - on losartan for HTN, continued - Echocardiogram in AM ordered - heparin gtt, aspirin 81 mg daily, NTG gtt  - metoprolol 12.5 mg bid - high dose atorvastatin 80 mg qHS first dose now   Shlomie Romig 08/22/2013, 11:28 PM

## 2013-08-22 NOTE — ED Provider Notes (Signed)
CSN: [id number]     Arrival date & time 08/22/13  1506 History  This chart was scribed for Sherry B. Bernette Mayers, MD by Leone Payor, ED Scribe. This patient was seen in room MH09/MH09 and the patient's care was started 3:34 PM.     Chief Complaint  Patient presents with  . Chest Pain    The history is provided by the patient. No language interpreter was used.    HPI Comments: Sherry Schultz is a 57 y.o. female with past medical history of HTN, DM, hypercholesterolemia who presents to the Emergency Department complaining of constant, waxing and waning central chest pain that began last night. Pt states the chest pain had eased this morning but began to increase in severity during the day today. She describes this pain as tightness and heaviness. She rates this as 7/10 currently. She reports having radiation through to the back between the shoulder blades. She denies doing any strenuous activity to cause these symptoms. Pt has SOB but states she has this at baseline from a prior PE. She takes 7.5 mg Coumadin daily and her levels were 1.7 three days ago. She has a history of breast cancer in 2005 and is in remission now. She denies any cardiac history and has not had a recent visit to the cardiologist. She denies kidney history except for kidney stones. She denies leg swelling, numbness, weakness. Also recently has had radicular low back pain, seen by PCP given pain meds and prednisone but the pain today is different.    Past Medical History  Diagnosis Date  . Diabetes mellitus without complication   . Hypertension   . High cholesterol   . Kidney stone    History reviewed. No pertinent past surgical history. No family history on file. History  Substance Use Topics  . Smoking status: Never Smoker   . Smokeless tobacco: Not on file  . Alcohol Use: Not on file   OB History   Grav Para Term Preterm Abortions TAB SAB Ect Mult Living                 Review of Systems A complete 10 system  review of systems was obtained and all systems are negative except as noted in the HPI and PMH.   Allergies  Codeine  Home Medications   Current Outpatient Rx  Name  Route  Sig  Dispense  Refill  . atorvastatin (LIPITOR) 20 MG tablet   Oral   Take 20 mg by mouth daily.         . calcium carbonate 1250 MG capsule   Oral   Take 1,250 mg by mouth 2 (two) times daily with a meal.         . losartan (COZAAR) 50 MG tablet   Oral   Take 50 mg by mouth daily.         . metFORMIN (GLUCOPHAGE) 500 MG tablet   Oral   Take by mouth 2 (two) times daily with a meal.         . PARoxetine (PAXIL) 20 MG tablet   Oral   Take 20 mg by mouth daily.         Marland Kitchen warfarin (COUMADIN) 7.5 MG tablet   Oral   Take 7.5 mg by mouth daily.          BP 136/80  Pulse 88  Temp(Src) 99 F (37.2 C) (Oral)  Resp 18  Wt 170 lb (77.111 kg)  SpO2 96% Physical Exam  Nursing note and vitals reviewed. Constitutional: She is oriented to person, place, and time. She appears well-developed and well-nourished.  HENT:  Head: Normocephalic and atraumatic.  Eyes: EOM are normal. Pupils are equal, round, and reactive to light.  Neck: Normal range of motion. Neck supple.  Cardiovascular: Normal rate, regular rhythm, normal heart sounds and intact distal pulses.   Pulmonary/Chest: Effort normal and breath sounds normal. She has no wheezes. She has no rales.  Abdominal: Bowel sounds are normal. She exhibits no distension. There is no tenderness.  Musculoskeletal: Normal range of motion. She exhibits no edema and no tenderness.  Neurological: She is alert and oriented to person, place, and time. She has normal strength. No cranial nerve deficit or sensory deficit.  Skin: Skin is warm and dry. No rash noted.  Psychiatric: She has a normal mood and affect.    ED Course  Procedures   CRITICAL CARE Performed by: Pollyann Savoy. Total critical care time: 45 Critical care time was exclusive of  separately billable procedures and treating other patients. Critical care was necessary to treat or prevent imminent or life-threatening deterioration. Critical care was time spent personally by me on the following activities: development of treatment plan with patient and/or surrogate as well as nursing, discussions with consultants, evaluation of patient's response to treatment, examination of patient, obtaining history from patient or surrogate, ordering and performing treatments and interventions, ordering and review of laboratory studies, ordering and review of radiographic studies, pulse oximetry and re-evaluation of patient's condition.   DIAGNOSTIC STUDIES: Oxygen Saturation is 96% on RA, adequate by my interpretation.    COORDINATION OF CARE: 3:40 PM Discussed treatment plan with pt at bedside and pt agreed to plan.   Labs Review Labs Reviewed  CBC WITH DIFFERENTIAL - Abnormal; Notable for the following:    WBC 13.2 (*)    Neutrophils Relative % 85 (*)    Lymphocytes Relative 6 (*)    Basophils Relative 2 (*)    Neutro Abs 11.2 (*)    Basophils Absolute 0.3 (*)    All other components within normal limits  BASIC METABOLIC PANEL - Abnormal; Notable for the following:    Glucose, Bld 238 (*)    BUN 27 (*)    GFR calc non Af Amer 60 (*)    GFR calc Af Amer 70 (*)    All other components within normal limits  PROTIME-INR - Abnormal; Notable for the following:    Prothrombin Time 21.0 (*)    INR 1.87 (*)    All other components within normal limits  GLUCOSE, CAPILLARY - Abnormal; Notable for the following:    Glucose-Capillary 135 (*)    All other components within normal limits  APTT  TROPONIN I   Imaging Review Ct Angio Chest Pe W/cm &/or Wo Cm  08/22/2013   CLINICAL DATA:  Constant chest pain with shortness of breath. Pain radiates to the back and increases with activity.  EXAM: CT ANGIOGRAPHY CHEST, ABDOMEN AND PELVIS  TECHNIQUE: Multidetector CT imaging through the  chest, abdomen and pelvis was performed using the standard protocol during bolus administration of intravenous contrast. Multiplanar reconstructed images including MIPs were obtained and reviewed to evaluate the vascular anatomy.  CONTRAST:  OMNIPAQUE IOHEXOL 350 MG/ML SOLN  COMPARISON:  Abdominal pelvic CT 09/24/2011.  FINDINGS: CTA CHEST FINDINGS  Pre contrast images demonstrate mild left subclavian and aortic atherosclerosis. No displaced intimal calcifications are identified. Post-contrast, the aorta opacifies normally. There is no evidence of aneurysm  or dissection. The pulmonary arteries are well opacified with contrast. There is no evidence of acute pulmonary embolism.  There is a 7 mm low-density lesion in the right thyroid lobe on image 16. No enlarged mediastinal or hilar lymph nodes are demonstrated. There is no significant pleural or pericardial effusion.  The lungs are clear. There is evidence of bilateral breast reconstruction. No worrisome osseous findings are demonstrated.  Review of the MIP images confirms the above findings.  CTA ABDOMEN AND PELVIS FINDINGS  The abdominal aorta is normal in caliber. There is no evidence of aneurysm or dissection. There are single renal arteries bilaterally which are widely patent. The celiac trunk, superior mesenteric artery and inferior mesenteric artery appear normal. Mild iliac atherosclerosis is present bilaterally.  The liver demonstrates mild steatosis, but no focal abnormality. The spleen, gallbladder, pancreas and adrenal glands appear unremarkable. Small low density renal lesions are noted bilaterally, not grossly changed. There is no hydronephrosis or recurrent perinephric soft tissue stranding.  Abdominal surgical clips are noted. The uterus is surgically absent. The appendix appears normal. There are no inflammatory changes. Both ovaries are visualized and appear unchanged status post partial hysterectomy. The bladder appears normal.  There are no  acute or worrisome osseous findings.  Review of the MIP images confirms the above findings.  IMPRESSION: 1. CTA demonstrates no aneurysm, dissection or thromboembolic disease. Scattered mild atherosclerosis is noted. 2. Small low-density right thyroid lesion, likely incidental. 3. Grossly stable small low density renal lesions, likely cysts. No recurrent ureteral obstruction. 4. Postsurgical changes as described.   Electronically Signed   By: Roxy Horseman M.D.   On: 08/22/2013 17:59   Ct Angio Pelvis W/cm &/or Wo/cm  08/22/2013   CLINICAL DATA:  Constant chest pain with shortness of breath. Pain radiates to the back and increases with activity.  EXAM: CT ANGIOGRAPHY CHEST, ABDOMEN AND PELVIS  TECHNIQUE: Multidetector CT imaging through the chest, abdomen and pelvis was performed using the standard protocol during bolus administration of intravenous contrast. Multiplanar reconstructed images including MIPs were obtained and reviewed to evaluate the vascular anatomy.  CONTRAST:  OMNIPAQUE IOHEXOL 350 MG/ML SOLN  COMPARISON:  Abdominal pelvic CT 09/24/2011.  FINDINGS: CTA CHEST FINDINGS  Pre contrast images demonstrate mild left subclavian and aortic atherosclerosis. No displaced intimal calcifications are identified. Post-contrast, the aorta opacifies normally. There is no evidence of aneurysm or dissection. The pulmonary arteries are well opacified with contrast. There is no evidence of acute pulmonary embolism.  There is a 7 mm low-density lesion in the right thyroid lobe on image 16. No enlarged mediastinal or hilar lymph nodes are demonstrated. There is no significant pleural or pericardial effusion.  The lungs are clear. There is evidence of bilateral breast reconstruction. No worrisome osseous findings are demonstrated.  Review of the MIP images confirms the above findings.  CTA ABDOMEN AND PELVIS FINDINGS  The abdominal aorta is normal in caliber. There is no evidence of aneurysm or dissection. There  are single renal arteries bilaterally which are widely patent. The celiac trunk, superior mesenteric artery and inferior mesenteric artery appear normal. Mild iliac atherosclerosis is present bilaterally.  The liver demonstrates mild steatosis, but no focal abnormality. The spleen, gallbladder, pancreas and adrenal glands appear unremarkable. Small low density renal lesions are noted bilaterally, not grossly changed. There is no hydronephrosis or recurrent perinephric soft tissue stranding.  Abdominal surgical clips are noted. The uterus is surgically absent. The appendix appears normal. There are no inflammatory changes. Both ovaries  are visualized and appear unchanged status post partial hysterectomy. The bladder appears normal.  There are no acute or worrisome osseous findings.  Review of the MIP images confirms the above findings.  IMPRESSION: 1. CTA demonstrates no aneurysm, dissection or thromboembolic disease. Scattered mild atherosclerosis is noted. 2. Small low-density right thyroid lesion, likely incidental. 3. Grossly stable small low density renal lesions, likely cysts. No recurrent ureteral obstruction. 4. Postsurgical changes as described.   Electronically Signed   By: Roxy Horseman M.D.   On: 08/22/2013 17:59   Ct Angio Abdomen W/cm &/or Wo Contrast  08/22/2013   CLINICAL DATA:  Constant chest pain with shortness of breath. Pain radiates to the back and increases with activity.  EXAM: CT ANGIOGRAPHY CHEST, ABDOMEN AND PELVIS  TECHNIQUE: Multidetector CT imaging through the chest, abdomen and pelvis was performed using the standard protocol during bolus administration of intravenous contrast. Multiplanar reconstructed images including MIPs were obtained and reviewed to evaluate the vascular anatomy.  CONTRAST:  OMNIPAQUE IOHEXOL 350 MG/ML SOLN  COMPARISON:  Abdominal pelvic CT 09/24/2011.  FINDINGS: CTA CHEST FINDINGS  Pre contrast images demonstrate mild left subclavian and aortic  atherosclerosis. No displaced intimal calcifications are identified. Post-contrast, the aorta opacifies normally. There is no evidence of aneurysm or dissection. The pulmonary arteries are well opacified with contrast. There is no evidence of acute pulmonary embolism.  There is a 7 mm low-density lesion in the right thyroid lobe on image 16. No enlarged mediastinal or hilar lymph nodes are demonstrated. There is no significant pleural or pericardial effusion.  The lungs are clear. There is evidence of bilateral breast reconstruction. No worrisome osseous findings are demonstrated.  Review of the MIP images confirms the above findings.  CTA ABDOMEN AND PELVIS FINDINGS  The abdominal aorta is normal in caliber. There is no evidence of aneurysm or dissection. There are single renal arteries bilaterally which are widely patent. The celiac trunk, superior mesenteric artery and inferior mesenteric artery appear normal. Mild iliac atherosclerosis is present bilaterally.  The liver demonstrates mild steatosis, but no focal abnormality. The spleen, gallbladder, pancreas and adrenal glands appear unremarkable. Small low density renal lesions are noted bilaterally, not grossly changed. There is no hydronephrosis or recurrent perinephric soft tissue stranding.  Abdominal surgical clips are noted. The uterus is surgically absent. The appendix appears normal. There are no inflammatory changes. Both ovaries are visualized and appear unchanged status post partial hysterectomy. The bladder appears normal.  There are no acute or worrisome osseous findings.  Review of the MIP images confirms the above findings.  IMPRESSION: 1. CTA demonstrates no aneurysm, dissection or thromboembolic disease. Scattered mild atherosclerosis is noted. 2. Small low-density right thyroid lesion, likely incidental. 3. Grossly stable small low density renal lesions, likely cysts. No recurrent ureteral obstruction. 4. Postsurgical changes as described.    Electronically Signed   By: Roxy Horseman M.D.   On: 08/22/2013 17:59    Date: 08/22/2013 1512  Rate: 97  Rhythm: normal sinus rhythm  QRS Axis: normal  Intervals: normal  ST/T Wave abnormalities: ST depressions inferiorly  Conduction Disutrbances:none  Narrative Interpretation:   Old EKG Reviewed: changes noted, ST depressions new   Date: 08/22/2013 1747  Rate: 78  Rhythm: normal sinus rhythm  QRS Axis: normal  Intervals: normal  ST/T Wave abnormalities: normal  Conduction Disutrbances:none  Narrative Interpretation:   Old EKG Reviewed: changes noted, ST depressions resolved      MDM   1. Unstable angina  EKG shows ST depression in inferior leads new from 2008. Concern for ACS vs recurrent PE vs aortic dissection. ASA and NTG ordered. CT angio pending.   6:32 PM CT neg. Pt had relief of CP initially with NTG but returned. Given additional NTG and started on heparin and NTG drip. Discussed with cardiology who will accept the patient for transfer to [facility] for further evaluation.   I personally performed the services described in this documentation, which was scribed in my presence. The recorded information has been reviewed and is accurate.      Sherry B. Bernette Mayers, MD 08/22/13 [phone]

## 2013-08-23 DIAGNOSIS — I10 Essential (primary) hypertension: Secondary | ICD-10-CM

## 2013-08-23 DIAGNOSIS — I379 Nonrheumatic pulmonary valve disorder, unspecified: Secondary | ICD-10-CM

## 2013-08-23 DIAGNOSIS — I200 Unstable angina: Secondary | ICD-10-CM

## 2013-08-23 LAB — HEPARIN LEVEL (UNFRACTIONATED): Heparin Unfractionated: 0.83 IU/mL — ABNORMAL HIGH (ref 0.30–0.70)

## 2013-08-23 LAB — BASIC METABOLIC PANEL
BUN: 20 mg/dL (ref 6–23)
CO2: 26 mEq/L (ref 19–32)
Chloride: 104 mEq/L (ref 96–112)
GFR calc Af Amer: 88 mL/min — ABNORMAL LOW (ref >90)
Potassium: 4.2 mEq/L (ref 3.5–5.1)

## 2013-08-23 LAB — TROPONIN I: Troponin I: <0.3 ng/mL (ref <0.30)

## 2013-08-23 LAB — COMPREHENSIVE METABOLIC PANEL
ALT: 21 U/L (ref 0–35)
AST: 14 U/L (ref 0–37)
Alkaline Phosphatase: 54 U/L (ref 39–117)
CO2: 24 mEq/L (ref 19–32)
Calcium: 8.8 mg/dL (ref 8.4–10.5)
GFR calc non Af Amer: 89 mL/min — ABNORMAL LOW (ref >90)
Potassium: 4 mEq/L (ref 3.5–5.1)
Sodium: 135 mEq/L (ref 135–145)

## 2013-08-23 LAB — HEMOGLOBIN A1C
Hgb A1c MFr Bld: 6.3 % — ABNORMAL HIGH (ref <5.7)
Mean Plasma Glucose: 134 mg/dL — ABNORMAL HIGH (ref <117)

## 2013-08-23 LAB — LIPID PANEL
HDL: 52 mg/dL (ref >39)
Triglycerides: 85 mg/dL (ref <150)

## 2013-08-23 LAB — CBC
MCHC: 33.8 g/dL (ref 30.0–36.0)
MCV: 99 fL (ref 78.0–100.0)
Platelets: 161 10*3/uL (ref 150–400)
RBC: 4 MIL/uL (ref 3.87–5.11)
RDW: 13.2 % (ref 11.5–15.5)

## 2013-08-23 LAB — PROTIME-INR
INR: 2.06 — ABNORMAL HIGH (ref 0.00–1.49)
Prothrombin Time: 22.6 seconds — ABNORMAL HIGH (ref 11.6–15.2)

## 2013-08-23 LAB — TSH: TSH: 2.669 u[IU]/mL (ref 0.350–4.500)

## 2013-08-23 LAB — GLUCOSE, CAPILLARY: Glucose-Capillary: 116 mg/dL — ABNORMAL HIGH (ref 70–99)

## 2013-08-23 MED ORDER — OXYCODONE-ACETAMINOPHEN 5-325 MG PO TABS
1.0000 | ORAL_TABLET | Freq: Four times a day (QID) | ORAL | Status: DC | PRN
  Administered 2013-08-23 – 2013-08-24 (×3): 1 via ORAL
  Administered 2013-08-24: 11:00:00 2 via ORAL
  Filled 2013-08-23 (×3): qty 1
  Filled 2013-08-23: qty 2

## 2013-08-23 MED ORDER — ASPIRIN EC 81 MG PO TBEC: 81.0000 mg | DELAYED_RELEASE_TABLET | Freq: Every day | ORAL | Status: DC

## 2013-08-23 MED ORDER — ACETAMINOPHEN 325 MG PO TABS: 650.0000 mg | ORAL_TABLET | ORAL | Status: DC | PRN

## 2013-08-23 MED ORDER — ASPIRIN 81 MG PO CHEW
81.0000 mg | CHEWABLE_TABLET | ORAL | Status: AC
  Administered 2013-08-24: 81 mg via ORAL
  Filled 2013-08-23: qty 1

## 2013-08-23 MED ORDER — SODIUM CHLORIDE 0.9 % IV SOLN: INTRAVENOUS | Status: DC

## 2013-08-23 MED ORDER — ZOLPIDEM TARTRATE 5 MG PO TABS
5.0000 mg | ORAL_TABLET | Freq: Every evening | ORAL | Status: DC | PRN
  Administered 2013-08-23: 5 mg via ORAL
  Filled 2013-08-23: qty 1

## 2013-08-23 MED ORDER — VITAMIN K1 10 MG/ML IJ SOLN
1.0000 mg | Freq: Once | INTRAVENOUS | Status: AC
  Administered 2013-08-23: 1 mg via INTRAVENOUS
  Filled 2013-08-23: qty 0.1

## 2013-08-23 MED ORDER — SODIUM CHLORIDE 0.9 % IV SOLN: 250.0000 mL | INTRAVENOUS | Status: DC | PRN

## 2013-08-23 MED ORDER — SODIUM CHLORIDE 0.9 % IJ SOLN
3.0000 mL | Freq: Two times a day (BID) | INTRAMUSCULAR | Status: DC
  Administered 2013-08-23: 3 mL via INTRAVENOUS

## 2013-08-23 MED ORDER — SODIUM CHLORIDE 0.9 % IJ SOLN: 3.0000 mL | INTRAMUSCULAR | Status: DC | PRN

## 2013-08-23 MED ORDER — ONDANSETRON HCL 4 MG/2ML IJ SOLN: 4.0000 mg | Freq: Four times a day (QID) | INTRAMUSCULAR | Status: DC | PRN

## 2013-08-23 NOTE — Progress Notes (Signed)
Md aware of SBP less than 100.  Pt resting. Will continue to monitor. Emilie [place] Liter

## 2013-08-23 NOTE — Progress Notes (Signed)
  Echocardiogram 2D Echocardiogram has been performed.  Sherry Schultz 08/23/2013, 10:51 AM

## 2013-08-23 NOTE — Care Management Note (Signed)
    Page 1 of 1   08/23/2013     9:27:46 AM   CARE MANAGEMENT NOTE 08/23/2013  Patient:  Sherry Schultz, Sherry Schultz   Account Number:  [id number]  Date Initiated:  08/23/2013  Documentation initiated by:  Junius Creamer  Subjective/Objective Assessment:   adm w angina     Action/Plan:   lives w friend   Anticipated DC Date:     Anticipated DC Plan:           Choice offered to / List presented to:             Status of service:   Medicare Important Message given?   (If response is "NO", the following Medicare IM given date fields will be blank) Date Medicare IM given:   Date Additional Medicare IM given:    Discharge Disposition:    Per UR Regulation:  Reviewed for med. necessity/level of care/duration of stay  If discussed at Long Length of Stay Meetings, dates discussed:    Comments:

## 2013-08-23 NOTE — Progress Notes (Signed)
PROGRESS NOTE  Subjective:   57 yo with hx of breast cancer, DM, dyslipidemia, DVT / PE, HTN who presents with symptoms of CP / tightness.  She is feeling better on Heparin and NTG. INR is 2.09 this am  Objective:    Vital Signs:   Temp:  [97.3 F (36.3 C)-99 F (37.2 C)] 98 F (36.7 C) (11/24 0700) Pulse Rate:  [51-88] 53 (11/24 0700) Resp:  [11-19] 18 (11/24 0700) BP: (86-136)/(37-80) 88/53 mmHg (11/24 0700) SpO2:  [96 %-100 %] 99 % (11/24 0700) FiO2 (%):  [36 %] 36 % (11/23 2105) Weight:  [170 lb (77.111 kg)-178 lb 12.7 oz (81.1 kg)] 177 lb 11.1 oz (80.6 kg) (11/24 0300)      24-hour weight change: Weight change:   Weight trends: Filed Weights   08/22/13 1525 08/22/13 2108 08/23/13 0300  Weight: 170 lb (77.111 kg) 178 lb 12.7 oz (81.1 kg) 177 lb 11.1 oz (80.6 kg)    Intake/Output:  11/23 0701 - 11/24 0700 In: 437.4 [P.O.:180; I.V.:257.4] Out: -      Physical Exam: BP 88/53  Pulse 53  Temp(Src) 98 F (36.7 C) (Oral)  Resp 18  Ht 5\' 5"  (1.651 m)  Wt 177 lb 11.1 oz (80.6 kg)  BMI 29.57 kg/m2  SpO2 99%  General: Vital signs reviewed and noted. NAD, pain free, slight HA from NTG drip  Head: Normocephalic, atraumatic.  Eyes: conjunctivae/corneas clear.  EOM's intact.   Throat: normal  Neck:  normal  Lungs:    clear  Heart:  RR, normal S1, S2  Abdomen:  Soft, non-tender, non-distended    Extremities: No edema,  Pulses are good   Neurologic: A&O X3, CN II - XII are grossly intact.   Psych: Normal     Labs: BMET:  Recent Labs  08/22/13 1541 08/23/13 0025 08/23/13 0350  NA 138 135 137  K 3.8 4.0 4.2  CL 97 101 104  CO2 26 24 26   GLUCOSE 238* 110* 111*  BUN 27* 22 20  CREATININE 1.00 0.79 0.83  CALCIUM 10.3 8.8 8.8  MG  --  2.4  --     Liver function tests:  Recent Labs  08/23/13 0025  AST 14  ALT 21  ALKPHOS 54  BILITOT 0.6  PROT 6.0  ALBUMIN 3.6   No results found for this basename: LIPASE, AMYLASE,  in the last 72  hours  CBC:  Recent Labs  08/22/13 1541 08/23/13 0350  WBC 13.2* 10.4  NEUTROABS 11.2*  --   HGB 14.9 13.4  HCT 44.8 39.6  MCV 98.9 99.0  PLT 190 161    Cardiac Enzymes:  Recent Labs  08/22/13 1541 08/22/13 2000 08/22/13 2132 08/23/13 0350  TROPONINI <0.30 <0.30 <0.30 <0.30    Coagulation Studies:  Recent Labs  08/22/13 1541 08/23/13 0350  LABPROT 21.0* 22.6*  INR 1.87* 2.06*    Other: No components found with this basename: POCBNP,  No results found for this basename: DDIMER,  in the last 72 hours No results found for this basename: HGBA1C,  in the last 72 hours  Recent Labs  08/23/13 0350  CHOL 144  HDL 52  LDLCALC 75  TRIG 85  CHOLHDL 2.8   No results found for this basename: TSH, T4TOTAL, FREET3, T3FREE, THYROIDAB,  in the last 72 hours No results found for this basename: VITAMINB12, FOLATE, FERRITIN, TIBC, IRON, RETICCTPCT,  in the last 72 hours   Other results:  EKG :  NSR no ST or T wave changes.   Medications:    Infusions: . sodium chloride 1,000 mL (08/22/13 2120)  . heparin 900 Units/hr (08/23/13 0150)  . nitroGLYCERIN 20 mcg/min (08/22/13 2118)    Scheduled Medications: . aspirin EC  81 mg Oral Daily  . atorvastatin  80 mg Oral q1800  . calcium carbonate  1,250 mg Oral BID WC  . losartan  50 mg Oral Daily  . metoprolol tartrate  12.5 mg Oral BID  . PARoxetine  20 mg Oral Daily    Assessment/ Plan:       Unstable angina - symptoms are worrisome for UAP  .  Troponin levels are negative.  I have recommended cath.  D/W Dr. Sanjuana Kava who would like to wait until INR is lower to do cath ( since patient is stable, enzymes negative)    Will hold coumadin.   Give 1. Mg IV vit K.      Essential hypertension - BP is actually low     T2DM (type 2 diabetes mellitus)   Dyslipidemia   Disposition:  Length of Stay: 1  Vesta Mixer, Montez Hageman., MD, [facility] 08/23/2013, 7:54 AM Office [phone] Pager [phone]

## 2013-08-23 NOTE — Progress Notes (Signed)
ANTICOAGULATION CONSULT NOTE - Follow Up Consult  Pharmacy Consult for heparin Indication: chest pain/ACS and h/o PE  Labs:  Recent Labs  08/22/13 1541 08/22/13 2000 08/22/13 2132 08/23/13 0025  HGB 14.9  --   --   --   HCT 44.8  --   --   --   PLT 190  --   --   --   APTT 31  --   --   --   LABPROT 21.0*  --   --   --   INR 1.87*  --   --   --   HEPARINUNFRC  --   --   --  0.83*  CREATININE 1.00  --   --   --   TROPONINI <0.30 <0.30 <0.30  --     Assessment: 57yo female supratherapeutic on heparin with initial dosing for CP and low INR for h/o PE, Coumadin on hold.  Of note pt has restricted access so labs drawn from same arm as heparin infusion though in [facility] so being drawn from below site.  Goal of Therapy:  Heparin level 0.3-0.7 units/ml   Plan:  Will decrease heparin gtt by 1 unit/kg/hr to 900 units/hr and check level with next lab draw.  Vernard Gambles, PharmD, BCPS  08/23/2013,1:46 AM

## 2013-08-23 NOTE — Progress Notes (Signed)
ANTICOAGULATION CONSULT NOTE - Follow Up Consult  Pharmacy Consult for heparin Indication: chest pain/ACS and h/o PE  Labs:  Recent Labs  08/22/13 1541  08/22/13 2132 08/23/13 0025 08/23/13 0350 08/23/13 0845  HGB 14.9  --   --   --  13.4  --   HCT 44.8  --   --   --  39.6  --   PLT 190  --   --   --  161  --   APTT 31  --   --   --   --   --   LABPROT 21.0*  --   --   --  22.6*  --   INR 1.87*  --   --   --  2.06*  --   HEPARINUNFRC  --   --   --  0.83*  --  0.63  CREATININE 1.00  --   --  0.79 0.83  --   TROPONINI <0.30  < > <0.30  --  <0.30 <0.30  < > = values in this interval not displayed.  Assessment: 57yo female on heparin for CP and noted at goal (HL= 0.63) on 900 units/hr. Patient on coumadin PTA for history of PE and this is on hold for cath. INR today= 2.06.  Home coumadin dose: 7.5mg /day  Goal of Therapy:  Heparin level 0.3-0.7 units/ml   Plan:  -Continue heparin at current rate -Daily CBC and heparin level  Harland German, Pharm D 08/23/2013 10:45 AM

## 2013-08-24 ENCOUNTER — Encounter (HOSPITAL_COMMUNITY)
Admission: EM | Disposition: A | Discharge status: Home / Self Care | Payer: Self-pay | Source: Home / Self Care | Attending: Internal Medicine

## 2013-08-24 ENCOUNTER — Encounter (HOSPITAL_COMMUNITY): Payer: Self-pay | Admitting: Physician Assistant

## 2013-08-24 DIAGNOSIS — R079 Chest pain, unspecified: Secondary | ICD-10-CM

## 2013-08-24 HISTORY — PX: LEFT HEART CATHETERIZATION WITH CORONARY ANGIOGRAM: SHX5451

## 2013-08-24 LAB — CBC
HCT: 39.3 % (ref 36.0–46.0)
Hemoglobin: 13.3 g/dL (ref 12.0–15.0)
MCH: 33.8 pg (ref 26.0–34.0)
MCHC: 33.8 g/dL (ref 30.0–36.0)
RDW: 13.1 % (ref 11.5–15.5)

## 2013-08-24 LAB — PROTIME-INR
INR: 1.37 (ref 0.00–1.49)
Prothrombin Time: 16.5 seconds — ABNORMAL HIGH (ref 11.6–15.2)

## 2013-08-24 LAB — HEPARIN LEVEL (UNFRACTIONATED): Heparin Unfractionated: 0.5 IU/mL (ref 0.30–0.70)

## 2013-08-24 LAB — GLUCOSE, CAPILLARY
Glucose-Capillary: 92 mg/dL (ref 70–99)
Glucose-Capillary: 90 mg/dL (ref 70–99)

## 2013-08-24 SURGERY — LEFT HEART CATHETERIZATION WITH CORONARY ANGIOGRAM
Anesthesia: LOCAL

## 2013-08-24 MED ORDER — MIDAZOLAM HCL 2 MG/2ML IJ SOLN
INTRAMUSCULAR | Status: AC
  Filled 2013-08-24: qty 2

## 2013-08-24 MED ORDER — HEPARIN SODIUM (PORCINE) 1000 UNIT/ML IJ SOLN
INTRAMUSCULAR | Status: AC
  Filled 2013-08-24: qty 1

## 2013-08-24 MED ORDER — OMEPRAZOLE 20 MG PO CPDR: 20.0000 mg | DELAYED_RELEASE_CAPSULE | Freq: Every day | ORAL | Status: AC

## 2013-08-24 MED ORDER — WARFARIN SODIUM 7.5 MG PO TABS
7.5000 mg | ORAL_TABLET | Freq: Once | ORAL | Status: DC
  Filled 2013-08-24: qty 1

## 2013-08-24 MED ORDER — VERAPAMIL HCL 2.5 MG/ML IV SOLN
INTRAVENOUS | Status: AC
  Filled 2013-08-24: qty 2

## 2013-08-24 MED ORDER — PANTOPRAZOLE SODIUM 40 MG PO TBEC
40.0000 mg | DELAYED_RELEASE_TABLET | Freq: Every day | ORAL | Status: DC
  Administered 2013-08-24: 15:00:00 40 mg via ORAL
  Filled 2013-08-24 (×2): qty 1

## 2013-08-24 MED ORDER — NITROGLYCERIN 0.2 MG/ML ON CALL CATH LAB
INTRAVENOUS | Status: AC
  Filled 2013-08-24: qty 1

## 2013-08-24 MED ORDER — SODIUM CHLORIDE 0.9 % IV SOLN
1.0000 mL/kg/h | INTRAVENOUS | Status: AC
  Administered 2013-08-24: 1 mL/kg/h via INTRAVENOUS

## 2013-08-24 MED ORDER — LIDOCAINE HCL (PF) 1 % IJ SOLN
INTRAMUSCULAR | Status: AC
  Filled 2013-08-24: qty 30

## 2013-08-24 MED ORDER — WARFARIN - PHARMACIST DOSING INPATIENT: Freq: Every day | Status: DC

## 2013-08-24 MED ORDER — FENTANYL CITRATE 0.05 MG/ML IJ SOLN
INTRAMUSCULAR | Status: AC
  Filled 2013-08-24: qty 2

## 2013-08-24 MED ORDER — LOSARTAN POTASSIUM 50 MG PO TABS: 25.0000 mg | ORAL_TABLET | Freq: Every day | ORAL | Status: AC

## 2013-08-24 MED ORDER — METFORMIN HCL 500 MG PO TABS: 500.0000 mg | ORAL_TABLET | Freq: Two times a day (BID) | ORAL | Status: AC

## 2013-08-24 MED ORDER — HEPARIN (PORCINE) IN NACL 2-0.9 UNIT/ML-% IJ SOLN
INTRAMUSCULAR | Status: AC
  Filled 2013-08-24: qty 1500

## 2013-08-24 NOTE — Discharge Summary (Signed)
Discharge Summary   Patient ID: Sherry Schultz MRN: [id number], DOB/AGE: 1956-07-23 58 y.o. Admit date: 08/22/2013 D/C date:     08/24/2013  Primary Cardiologist: Nahser  Primary Discharge Diagnoses:  1. Chest pain, question GERD - no significant CAD by cath, normal LV function, CTA neg for PE or dissection  Secondary Discharge Diagnoses:  1. Diabetes mellitus without complication  2. Hypertension  3. High cholesterol  4. Kidney stone 5. Breast CA s/p radical mastectomy 6. Prior DVT/PE  Hospital Course: Sherry Schultz is a 57 yo woman with PMH of breast cancer '05 s/p radical mastectomy, T2DM on metformin, dyslipidemia, prior DVT/PE, hypertension who presented to [facility] 08/22/2013 with substernal chest pain. It began the night before admission lasting intermittently for dozens of minutes that worsened on day of admission. She characterized the pain as heavy and tight with radiation to her back between her shoulder blades. She has some associated shortness of breath. INR was 1.9. Her daily warfarin dosing is 7.5 mg qHS. Her initial ECG was noted to have inferior ST depression with CP at that time that improved with SL NTG. She had CP again requiring NTG leading to the ER team to start heparin and NTG gtt for unstable angina. Pain was also relieved with morphine. She had a negative CTA for PE in the ER and was then evaluated by cardiology. She otherwise has been doing well at home without exercise limitations as recently as a few days ago - no PND, no orthopnea, can walk up multiple flights of stairs. Coumadin was held in prep for cardiac cath given a concern for unstable angina. Echo showed EF 60-65% without significant abnormality. troponins remained negative. Labs were otherwise unremarkable. She underwent cardiac cath this morning demonstrating no significant CAD, normal LV function. Dr. Swaziland recommended PPI for perhaps GERD related pain. Coumadin will be resumed. Dr. Swaziland has  seen and examined the patient today and feels she is stable for discharge.  CTA did show small R thyroid lesion likely incidental, and stable kidney cysts. She was instructed to f/u PCP to discuss if any further workup is necessary. She was also instructed to f/u INR on Friday ([facility]). Due to borderline soft BP's this admission, Cozaar will be cut in half at discharge (originally substituted with metoprolol as inpatient prior to clarification of coronary status). Metformin will be held 48hr post cath.  Discharge Vitals: Blood pressure 147/64, pulse 55, temperature 97.8 F (36.6 C), temperature source Oral, resp. rate 16, height 5\' 5"  (1.651 m), weight 177 lb 0.5 oz (80.3 kg), SpO2 93.00%.  Labs: Lab Results  Component Value Date   WBC 10.4 08/24/2013   HGB 13.3 08/24/2013   HCT 39.3 08/24/2013   MCV 99.7 08/24/2013   PLT 140* 08/24/2013     Recent Labs Lab 08/23/13 0025 08/23/13 0350  NA 135 137  K 4.0 4.2  CL 101 104  CO2 24 26  BUN 22 20  CREATININE 0.79 0.83  CALCIUM 8.8 8.8  PROT 6.0  --   BILITOT 0.6  --   ALKPHOS 54  --   ALT 21  --   AST 14  --   GLUCOSE 110* 111*    Recent Labs  08/22/13 2000 08/22/13 2132 08/23/13 0350 08/23/13 0845  TROPONINI <0.30 <0.30 <0.30 <0.30   Lab Results  Component Value Date   CHOL 144 08/23/2013   HDL 52 08/23/2013   LDLCALC 75 08/23/2013   TRIG 85 08/23/2013   No  results found for this basename: DDIMER    Diagnostic Studies/Procedures   Ct Angio Chest Pe W/cm &/or Wo Cm  08/22/2013   CLINICAL DATA:  Constant chest pain with shortness of breath. Pain radiates to the back and increases with activity.  EXAM: CT ANGIOGRAPHY CHEST, ABDOMEN AND PELVIS  TECHNIQUE: Multidetector CT imaging through the chest, abdomen and pelvis was performed using the standard protocol during bolus administration of intravenous contrast. Multiplanar reconstructed images including MIPs were obtained and reviewed to evaluate the vascular  anatomy.  CONTRAST:  OMNIPAQUE IOHEXOL 350 MG/ML SOLN  COMPARISON:  Abdominal pelvic CT 09/24/2011.  FINDINGS: CTA CHEST FINDINGS  Pre contrast images demonstrate mild left subclavian and aortic atherosclerosis. No displaced intimal calcifications are identified. Post-contrast, the aorta opacifies normally. There is no evidence of aneurysm or dissection. The pulmonary arteries are well opacified with contrast. There is no evidence of acute pulmonary embolism.  There is a 7 mm low-density lesion in the right thyroid lobe on image 16. No enlarged mediastinal or hilar lymph nodes are demonstrated. There is no significant pleural or pericardial effusion.  The lungs are clear. There is evidence of bilateral breast reconstruction. No worrisome osseous findings are demonstrated.  Review of the MIP images confirms the above findings.  CTA ABDOMEN AND PELVIS FINDINGS  The abdominal aorta is normal in caliber. There is no evidence of aneurysm or dissection. There are single renal arteries bilaterally which are widely patent. The celiac trunk, superior mesenteric artery and inferior mesenteric artery appear normal. Mild iliac atherosclerosis is present bilaterally.  The liver demonstrates mild steatosis, but no focal abnormality. The spleen, gallbladder, pancreas and adrenal glands appear unremarkable. Small low density renal lesions are noted bilaterally, not grossly changed. There is no hydronephrosis or recurrent perinephric soft tissue stranding.  Abdominal surgical clips are noted. The uterus is surgically absent. The appendix appears normal. There are no inflammatory changes. Both ovaries are visualized and appear unchanged status post partial hysterectomy. The bladder appears normal.  There are no acute or worrisome osseous findings.  Review of the MIP images confirms the above findings.  IMPRESSION: 1. CTA demonstrates no aneurysm, dissection or thromboembolic disease. Scattered mild atherosclerosis is noted. 2.  Small low-density right thyroid lesion, likely incidental. 3. Grossly stable small low density renal lesions, likely cysts. No recurrent ureteral obstruction. 4. Postsurgical changes as described.   Electronically Signed   By: Roxy Horseman M.D.   On: 08/22/2013 17:59   Ct Angio Pelvis W/cm &/or Wo/cm  08/22/2013   CLINICAL DATA:  Constant chest pain with shortness of breath. Pain radiates to the back and increases with activity.  EXAM: CT ANGIOGRAPHY CHEST, ABDOMEN AND PELVIS  TECHNIQUE: Multidetector CT imaging through the chest, abdomen and pelvis was performed using the standard protocol during bolus administration of intravenous contrast. Multiplanar reconstructed images including MIPs were obtained and reviewed to evaluate the vascular anatomy.  CONTRAST:  OMNIPAQUE IOHEXOL 350 MG/ML SOLN  COMPARISON:  Abdominal pelvic CT 09/24/2011.  FINDINGS: CTA CHEST FINDINGS  Pre contrast images demonstrate mild left subclavian and aortic atherosclerosis. No displaced intimal calcifications are identified. Post-contrast, the aorta opacifies normally. There is no evidence of aneurysm or dissection. The pulmonary arteries are well opacified with contrast. There is no evidence of acute pulmonary embolism.  There is a 7 mm low-density lesion in the right thyroid lobe on image 16. No enlarged mediastinal or hilar lymph nodes are demonstrated. There is no significant pleural or pericardial effusion.  The  lungs are clear. There is evidence of bilateral breast reconstruction. No worrisome osseous findings are demonstrated.  Review of the MIP images confirms the above findings.  CTA ABDOMEN AND PELVIS FINDINGS  The abdominal aorta is normal in caliber. There is no evidence of aneurysm or dissection. There are single renal arteries bilaterally which are widely patent. The celiac trunk, superior mesenteric artery and inferior mesenteric artery appear normal. Mild iliac atherosclerosis is present bilaterally.  The liver  demonstrates mild steatosis, but no focal abnormality. The spleen, gallbladder, pancreas and adrenal glands appear unremarkable. Small low density renal lesions are noted bilaterally, not grossly changed. There is no hydronephrosis or recurrent perinephric soft tissue stranding.  Abdominal surgical clips are noted. The uterus is surgically absent. The appendix appears normal. There are no inflammatory changes. Both ovaries are visualized and appear unchanged status post partial hysterectomy. The bladder appears normal.  There are no acute or worrisome osseous findings.  Review of the MIP images confirms the above findings.  IMPRESSION: 1. CTA demonstrates no aneurysm, dissection or thromboembolic disease. Scattered mild atherosclerosis is noted. 2. Small low-density right thyroid lesion, likely incidental. 3. Grossly stable small low density renal lesions, likely cysts. No recurrent ureteral obstruction. 4. Postsurgical changes as described.   Electronically Signed   By: Roxy Horseman M.D.   On: 08/22/2013 17:59   Ct Angio Abdomen W/cm &/or Wo Contrast  08/22/2013   CLINICAL DATA:  Constant chest pain with shortness of breath. Pain radiates to the back and increases with activity.  EXAM: CT ANGIOGRAPHY CHEST, ABDOMEN AND PELVIS  TECHNIQUE: Multidetector CT imaging through the chest, abdomen and pelvis was performed using the standard protocol during bolus administration of intravenous contrast. Multiplanar reconstructed images including MIPs were obtained and reviewed to evaluate the vascular anatomy.  CONTRAST:  OMNIPAQUE IOHEXOL 350 MG/ML SOLN  COMPARISON:  Abdominal pelvic CT 09/24/2011.  FINDINGS: CTA CHEST FINDINGS  Pre contrast images demonstrate mild left subclavian and aortic atherosclerosis. No displaced intimal calcifications are identified. Post-contrast, the aorta opacifies normally. There is no evidence of aneurysm or dissection. The pulmonary arteries are well opacified with contrast. There is  no evidence of acute pulmonary embolism.  There is a 7 mm low-density lesion in the right thyroid lobe on image 16. No enlarged mediastinal or hilar lymph nodes are demonstrated. There is no significant pleural or pericardial effusion.  The lungs are clear. There is evidence of bilateral breast reconstruction. No worrisome osseous findings are demonstrated.  Review of the MIP images confirms the above findings.  CTA ABDOMEN AND PELVIS FINDINGS  The abdominal aorta is normal in caliber. There is no evidence of aneurysm or dissection. There are single renal arteries bilaterally which are widely patent. The celiac trunk, superior mesenteric artery and inferior mesenteric artery appear normal. Mild iliac atherosclerosis is present bilaterally.  The liver demonstrates mild steatosis, but no focal abnormality. The spleen, gallbladder, pancreas and adrenal glands appear unremarkable. Small low density renal lesions are noted bilaterally, not grossly changed. There is no hydronephrosis or recurrent perinephric soft tissue stranding.  Abdominal surgical clips are noted. The uterus is surgically absent. The appendix appears normal. There are no inflammatory changes. Both ovaries are visualized and appear unchanged status post partial hysterectomy. The bladder appears normal.  There are no acute or worrisome osseous findings.  Review of the MIP images confirms the above findings.  IMPRESSION: 1. CTA demonstrates no aneurysm, dissection or thromboembolic disease. Scattered mild atherosclerosis is noted. 2. Small low-density right  thyroid lesion, likely incidental. 3. Grossly stable small low density renal lesions, likely cysts. No recurrent ureteral obstruction. 4. Postsurgical changes as described.   Electronically Signed   By: Roxy Horseman M.D.   On: 08/22/2013 17:59   2D Echo 08/23/13 Left ventricle: The cavity size was normal. Wall thickness was normal. Systolic function was normal. The estimated ejection fraction was  in the range of 60% to 65%.   Cardiac Cath 08/24/13 Cardiac Catheterization Procedure Note  Name: Sherry Schultz  MRN: [id number]  DOB: 1955/12/07  Procedure: Left Heart Cath, Selective Coronary Angiography, LV angiography  Indication: 57 yo WF with history of DM, HTN presents with symptoms of chest pain concerning for unstable angina. CTA of the chest was negative for PE or dissection.  Procedural Details: The right wrist was prepped, draped, and anesthetized with 1% lidocaine. Using the modified Seldinger technique, a 5 French sheath was introduced into the right radial artery. 3 mg of verapamil was administered through the sheath, weight-based unfractionated heparin was administered intravenously. Standard Judkins catheters were used for selective coronary angiography and left ventriculography. Catheter exchanges were performed over an exchange length guidewire. There were no immediate procedural complications. A TR band was used for radial hemostasis at the completion of the procedure. The patient was transferred to the post catheterization recovery area for further monitoring.  Procedural Findings:  Hemodynamics:  AO 110/62 mean 84 mm Hg  LV 110/19 mm Hg  Coronary angiography:  Coronary dominance: right  Left mainstem: Normal.  Left anterior descending (LAD): Normal  Left circumflex (LCx): There is mld ectasia in the distal vessel. Otherwise normal.  Right coronary artery (RCA): Normal.  Left ventriculography: Left ventricular systolic function is normal, LVEF is estimated at 55-65%, there is no significant mitral regurgitation  Final Conclusions:  1. No significant CAD  2. Normal LV function.  Recommendations: Will resume coumadin. Start PPI. OK for discharge later today.  Discharge Medications     Medication List         atorvastatin 20 MG tablet  Commonly known as:  LIPITOR  Take 20 mg by mouth daily.     CALCIUM 1200 PO  Take 1,200 mg by mouth 2 (two) times daily.      HYDROcodone-acetaminophen 5-325 MG per tablet  Commonly known as:  NORCO/VICODIN  Take 1 tablet by mouth every 6 (six) hours as needed for moderate pain.     ibandronate 150 MG tablet  Commonly known as:  BONIVA  Take 150 mg by mouth every 30 (thirty) days.     losartan 50 MG tablet  Commonly known as:  COZAAR  Take 0.5 tablets (25 mg total) by mouth daily.     metFORMIN 500 MG tablet  Commonly known as:  GLUCOPHAGE  Take 1 tablet (500 mg total) by mouth 2 (two) times daily with a meal.  Start taking on:  08/26/2013     multivitamin with minerals Tabs tablet  Take 1 tablet by mouth daily.     omeprazole 20 MG capsule  Commonly known as:  PRILOSEC  Take 1 capsule (20 mg total) by mouth daily.     PARoxetine 20 MG tablet  Commonly known as:  PAXIL  Take 20 mg by mouth daily.     warfarin 7.5 MG tablet  Commonly known as:  COUMADIN  Take 7.5 mg by mouth every evening.        Disposition   The patient will be discharged in stable condition to home. Discharge  Orders   Future Orders Complete By Expires   Diet - low sodium heart healthy  As directed    Discharge instructions  As directed    Comments:     Dr. Swaziland would like you to start a trial of omeprazole to see if it prevents your chest pain from coming back. This is a medicine designed to reduce stomach acid inflammation, which can mimic heart pain.  Do not restart Metformin until the evening of 08/26/13.   Increase activity slowly  As directed    Comments:     No driving for 2 days. No lifting over 5 lbs for 1 week. No sexual activity for 1 week. You may return to work on 08/30/13. Keep procedure site clean & dry. If you notice increased pain, swelling, bleeding or pus, call/return!  You may shower, but no soaking baths/hot tubs/pools for 1 week.     Follow-up Information   Follow up with Elyn Aquas., MD. (as needed)    Specialty:  Cardiology   Contact information:   [address] [place] [address] [phone]       Follow up with Primary Care Provider. (Your CT scan showed a small possible thyroid nodule and stable, unchanged small kidney cysts. Please follow up with your primary care doctor to discuss if any further monitoring is needed.)       Follow up with Coumadin Check. (Please call your Coumadin clinic to see about scheduling a follow-up check on Friday to make sure your level is rising back to goal. If they are not open Friday due to the holiday, please see if you should have it checked tomorrow instead)         Duration of Discharge Encounter: Greater than 30 minutes including physician and PA time.  Signed, Ronie Spies PA-C 08/24/2013, 12:23 PM

## 2013-08-24 NOTE — Interval H&P Note (Signed)
History and Physical Interval Note:  08/24/2013 8:46 AM  Arvilla Market  has presented today for surgery, with the diagnosis of cp  The various methods of treatment have been discussed with the patient and family. After consideration of risks, benefits and other options for treatment, the patient has consented to  Procedure(s): LEFT HEART CATHETERIZATION WITH CORONARY ANGIOGRAM (N/A) as a surgical intervention .  The patient's history has been reviewed, patient examined, no change in status, stable for surgery.  I have reviewed the patient's chart and labs.  Questions were answered to the patient's satisfaction.   Cath Lab Visit (complete for each Cath Lab visit)  Clinical Evaluation Leading to the Procedure:   ACS: yes  Non-ACS:    Anginal Classification: CCS IV  Anti-ischemic medical therapy: Minimal Therapy (1 class of medications)  Non-Invasive Test Results: No non-invasive testing performed  Prior CABG: No previous CABG        Theron Arista [facility] 08/24/2013 8:46 AM

## 2013-08-24 NOTE — CV Procedure (Signed)
    Cardiac Catheterization Procedure Note  Name: Sherry Schultz MRN: [id number] DOB: Mar 04, 1956  Procedure: Left Heart Cath, Selective Coronary Angiography, LV angiography  Indication: 57 yo WF with history of DM, HTN presents with symptoms of chest pain concerning for unstable angina. CTA of the chest was negative for PE or dissection.   Procedural Details: The right wrist was prepped, draped, and anesthetized with 1% lidocaine. Using the modified Seldinger technique, a 5 French sheath was introduced into the right radial artery. 3 mg of verapamil was administered through the sheath, weight-based unfractionated heparin was administered intravenously. Standard Judkins catheters were used for selective coronary angiography and left ventriculography. Catheter exchanges were performed over an exchange length guidewire. There were no immediate procedural complications. A TR band was used for radial hemostasis at the completion of the procedure.  The patient was transferred to the post catheterization recovery area for further monitoring.  Procedural Findings: Hemodynamics: AO 110/62 mean 84 mm Hg LV 110/19 mm Hg  Coronary angiography: Coronary dominance: right  Left mainstem: Normal.   Left anterior descending (LAD): Normal  Left circumflex (LCx):  There is mld ectasia in the distal vessel. Otherwise normal.  Right coronary artery (RCA): Normal.  Left ventriculography: Left ventricular systolic function is normal, LVEF is estimated at 55-65%, there is no significant mitral regurgitation   Final Conclusions:   1. No significant CAD 2. Normal LV function.  Recommendations: Will resume coumadin. Start PPI. OK for discharge later today.  Theron Arista [facility] 08/24/2013, 9:19 AM

## 2013-08-24 NOTE — Discharge Summary (Signed)
Patient seen and examined and history reviewed. Agree with above findings and plan. Please refer to cath note earlier today.  Sherry Schultz 08/24/2013 2:47 PM

## 2013-08-24 NOTE — Progress Notes (Signed)
ANTICOAGULATION CONSULT NOTE - Follow Up Consult  Pharmacy Consult for Coumadin Indication: h/o of PE  Labs:  Recent Labs  08/22/13 1541  08/22/13 2132 08/23/13 0025 08/23/13 0350 08/23/13 0845 08/24/13 0542  HGB 14.9  --   --   --  13.4  --  13.3  HCT 44.8  --   --   --  39.6  --  39.3  PLT 190  --   --   --  161  --  140*  APTT 31  --   --   --   --   --   --   LABPROT 21.0*  --   --   --  22.6*  --  16.5*  INR 1.87*  --   --   --  2.06*  --  1.37  HEPARINUNFRC  --   --   --  0.83*  --  0.63 0.50  CREATININE 1.00  --   --  0.79 0.83  --   --   TROPONINI <0.30  < > <0.30  --  <0.30 <0.30  --   < > = values in this interval not displayed.  Assessment: 57yo female initially admitted for chest pain on heparin, now s/p cath to resume coumadin.  CTA of chest was negative for PE or dissection, cath findings were no significant CAD and normal LV function.  Plan is for discharge later today.   Patient's home coumadin dose is 7.5mg  daily.  INR today=1.37  Goal of Therapy:  INR=2-3   Plan:  Coumadin 7.5mg  po x 1 dose today. Daily PT/INR if not discharged today.   Wendie Simmer, PharmD, BCPS Clinical Pharmacist  Pager: [phone]

## 2013-08-24 NOTE — H&P (View-Only) (Signed)
    PROGRESS NOTE  Subjective:   57 yo with hx of breast cancer, DM, dyslipidemia, DVT / PE, HTN who presents with symptoms of CP / tightness.  She is feeling better on Heparin and NTG. INR is 2.09 this am  Objective:    Vital Signs:   Temp:  [97.3 F (36.3 C)-99 F (37.2 C)] 98 F (36.7 C) (11/24 0700) Pulse Rate:  [51-88] 53 (11/24 0700) Resp:  [11-19] 18 (11/24 0700) BP: (86-136)/(37-80) 88/53 mmHg (11/24 0700) SpO2:  [96 %-100 %] 99 % (11/24 0700) FiO2 (%):  [36 %] 36 % (11/23 2105) Weight:  [170 lb (77.111 kg)-178 lb 12.7 oz (81.1 kg)] 177 lb 11.1 oz (80.6 kg) (11/24 0300)      24-hour weight change: Weight change:   Weight trends: Filed Weights   08/22/13 1525 08/22/13 2108 08/23/13 0300  Weight: 170 lb (77.111 kg) 178 lb 12.7 oz (81.1 kg) 177 lb 11.1 oz (80.6 kg)    Intake/Output:  11/23 0701 - 11/24 0700 In: 437.4 [P.O.:180; I.V.:257.4] Out: -      Physical Exam: BP 88/53  Pulse 53  Temp(Src) 98 F (36.7 C) (Oral)  Resp 18  Ht 5' 5" (1.651 m)  Wt 177 lb 11.1 oz (80.6 kg)  BMI 29.57 kg/m2  SpO2 99%  General: Vital signs reviewed and noted. NAD, pain free, slight HA from NTG drip  Head: Normocephalic, atraumatic.  Eyes: conjunctivae/corneas clear.  EOM's intact.   Throat: normal  Neck:  normal  Lungs:    clear  Heart:  RR, normal S1, S2  Abdomen:  Soft, non-tender, non-distended    Extremities: No edema,  Pulses are good   Neurologic: A&O X3, CN II - XII are grossly intact.   Psych: Normal     Labs: BMET:  Recent Labs  08/22/13 1541 08/23/13 0025 08/23/13 0350  NA 138 135 137  K 3.8 4.0 4.2  CL 97 101 104  CO2 26 24 26  GLUCOSE 238* 110* 111*  BUN 27* 22 20  CREATININE 1.00 0.79 0.83  CALCIUM 10.3 8.8 8.8  MG  --  2.4  --     Liver function tests:  Recent Labs  08/23/13 0025  AST 14  ALT 21  ALKPHOS 54  BILITOT 0.6  PROT 6.0  ALBUMIN 3.6   No results found for this basename: LIPASE, AMYLASE,  in the last 72  hours  CBC:  Recent Labs  08/22/13 1541 08/23/13 0350  WBC 13.2* 10.4  NEUTROABS 11.2*  --   HGB 14.9 13.4  HCT 44.8 39.6  MCV 98.9 99.0  PLT 190 161    Cardiac Enzymes:  Recent Labs  08/22/13 1541 08/22/13 2000 08/22/13 2132 08/23/13 0350  TROPONINI <0.30 <0.30 <0.30 <0.30    Coagulation Studies:  Recent Labs  08/22/13 1541 08/23/13 0350  LABPROT 21.0* 22.6*  INR 1.87* 2.06*    Other: No components found with this basename: POCBNP,  No results found for this basename: DDIMER,  in the last 72 hours No results found for this basename: HGBA1C,  in the last 72 hours  Recent Labs  08/23/13 0350  CHOL 144  HDL 52  LDLCALC 75  TRIG 85  CHOLHDL 2.8   No results found for this basename: TSH, T4TOTAL, FREET3, T3FREE, THYROIDAB,  in the last 72 hours No results found for this basename: VITAMINB12, FOLATE, FERRITIN, TIBC, IRON, RETICCTPCT,  in the last 72 hours   Other results:  EKG :    NSR no ST or T wave changes.   Medications:    Infusions: . sodium chloride 1,000 mL (08/22/13 2120)  . heparin 900 Units/hr (08/23/13 0150)  . nitroGLYCERIN 20 mcg/min (08/22/13 2118)    Scheduled Medications: . aspirin EC  81 mg Oral Daily  . atorvastatin  80 mg Oral q1800  . calcium carbonate  1,250 mg Oral BID WC  . losartan  50 mg Oral Daily  . metoprolol tartrate  12.5 mg Oral BID  . PARoxetine  20 mg Oral Daily    Assessment/ Plan:       Unstable angina - symptoms are worrisome for UAP  .  Troponin levels are negative.  I have recommended cath.  D/W Dr. McAlhaney who would like to wait until INR is lower to do cath ( since patient is stable, enzymes negative)    Will hold coumadin.   Give 1. Mg IV vit K.      Essential hypertension - BP is actually low     T2DM (type 2 diabetes mellitus)   Dyslipidemia   Disposition:  Length of Stay: 1  Philip J. Nahser, Jr., MD, FACC 08/23/2013, 7:54 AM Office [phone] Pager [phone]    

## 2014-04-18 ENCOUNTER — Other Ambulatory Visit: Payer: Self-pay

## 2014-04-18 DIAGNOSIS — Z9012 Acquired absence of left breast and nipple: Secondary | ICD-10-CM

## 2014-04-18 DIAGNOSIS — Z853 Personal history of malignant neoplasm of breast: Secondary | ICD-10-CM

## 2014-04-29 ENCOUNTER — Ambulatory Visit
Admission: RE | Admit: 2014-04-29 | Discharge: 2014-04-29 | Disposition: A | Discharge status: Home / Self Care | Payer: 59 | Source: Ambulatory Visit

## 2014-04-29 ENCOUNTER — Encounter (INDEPENDENT_AMBULATORY_CARE_PROVIDER_SITE_OTHER): Payer: Self-pay

## 2014-04-29 DIAGNOSIS — Z9012 Acquired absence of left breast and nipple: Secondary | ICD-10-CM

## 2014-04-29 DIAGNOSIS — Z853 Personal history of malignant neoplasm of breast: Secondary | ICD-10-CM

## 2014-09-08 ENCOUNTER — Encounter (HOSPITAL_COMMUNITY): Payer: Self-pay | Admitting: Cardiology

## 2015-04-14 ENCOUNTER — Other Ambulatory Visit: Payer: Self-pay

## 2015-04-14 DIAGNOSIS — Z1231 Encounter for screening mammogram for malignant neoplasm of breast: Secondary | ICD-10-CM

## 2015-04-18 ENCOUNTER — Ambulatory Visit: Payer: Self-pay

## 2015-05-02 ENCOUNTER — Ambulatory Visit: Payer: Self-pay

## 2015-05-02 ENCOUNTER — Ambulatory Visit
Admission: RE | Admit: 2015-05-02 | Discharge: 2015-05-02 | Disposition: A | Discharge status: Home / Self Care | Payer: 59 | Source: Ambulatory Visit

## 2015-05-02 DIAGNOSIS — Z1231 Encounter for screening mammogram for malignant neoplasm of breast: Secondary | ICD-10-CM

## 2015-05-17 ENCOUNTER — Encounter (HOSPITAL_BASED_OUTPATIENT_CLINIC_OR_DEPARTMENT_OTHER): Payer: Self-pay

## 2015-05-17 ENCOUNTER — Emergency Department (HOSPITAL_BASED_OUTPATIENT_CLINIC_OR_DEPARTMENT_OTHER)
Admission: EM | Admit: 2015-05-17 | Discharge: 2015-05-17 | Disposition: A | Discharge status: Home / Self Care | Payer: 59 | Attending: Emergency Medicine | Admitting: Emergency Medicine

## 2015-05-17 DIAGNOSIS — I10 Essential (primary) hypertension: Secondary | ICD-10-CM | POA: Insufficient documentation

## 2015-05-17 DIAGNOSIS — Z23 Encounter for immunization: Secondary | ICD-10-CM | POA: Insufficient documentation

## 2015-05-17 DIAGNOSIS — Z79899 Other long term (current) drug therapy: Secondary | ICD-10-CM | POA: Insufficient documentation

## 2015-05-17 DIAGNOSIS — Y9389 Activity, other specified: Secondary | ICD-10-CM | POA: Insufficient documentation

## 2015-05-17 DIAGNOSIS — Z87442 Personal history of urinary calculi: Secondary | ICD-10-CM | POA: Diagnosis not present

## 2015-05-17 DIAGNOSIS — S61412A Laceration without foreign body of left hand, initial encounter: Secondary | ICD-10-CM | POA: Diagnosis not present

## 2015-05-17 DIAGNOSIS — Y998 Other external cause status: Secondary | ICD-10-CM | POA: Insufficient documentation

## 2015-05-17 DIAGNOSIS — Z853 Personal history of malignant neoplasm of breast: Secondary | ICD-10-CM | POA: Diagnosis not present

## 2015-05-17 DIAGNOSIS — Z7901 Long term (current) use of anticoagulants: Secondary | ICD-10-CM | POA: Diagnosis not present

## 2015-05-17 DIAGNOSIS — E119 Type 2 diabetes mellitus without complications: Secondary | ICD-10-CM | POA: Diagnosis not present

## 2015-05-17 DIAGNOSIS — W260XXA Contact with knife, initial encounter: Secondary | ICD-10-CM | POA: Insufficient documentation

## 2015-05-17 DIAGNOSIS — Z86711 Personal history of pulmonary embolism: Secondary | ICD-10-CM | POA: Insufficient documentation

## 2015-05-17 DIAGNOSIS — Z86718 Personal history of other venous thrombosis and embolism: Secondary | ICD-10-CM | POA: Insufficient documentation

## 2015-05-17 DIAGNOSIS — E780 Pure hypercholesterolemia: Secondary | ICD-10-CM | POA: Insufficient documentation

## 2015-05-17 DIAGNOSIS — Z9889 Other specified postprocedural states: Secondary | ICD-10-CM | POA: Insufficient documentation

## 2015-05-17 DIAGNOSIS — Y9289 Other specified places as the place of occurrence of the external cause: Secondary | ICD-10-CM | POA: Insufficient documentation

## 2015-05-17 HISTORY — DX: Malignant neoplasm of unspecified site of unspecified female breast: C50.919

## 2015-05-17 HISTORY — DX: Acute embolism and thrombosis of unspecified deep veins of unspecified lower extremity: I82.409

## 2015-05-17 HISTORY — DX: Other pulmonary embolism without acute cor pulmonale: I26.99

## 2015-05-17 MED ORDER — CEPHALEXIN 500 MG PO CAPS: 500.0000 mg | ORAL_CAPSULE | Freq: Two times a day (BID) | ORAL | Status: DC

## 2015-05-17 MED ORDER — TETANUS-DIPHTH-ACELL PERTUSSIS 5-2.5-18.5 LF-MCG/0.5 IM SUSP
0.5000 mL | Freq: Once | INTRAMUSCULAR | Status: AC
  Administered 2015-05-17: 0.5 mL via INTRAMUSCULAR
  Filled 2015-05-17: qty 0.5

## 2015-05-17 MED ORDER — LIDOCAINE-EPINEPHRINE 2 %-1:100000 IJ SOLN
INTRAMUSCULAR | Status: AC
  Filled 2015-05-17: qty 1

## 2015-05-17 NOTE — ED Provider Notes (Signed)
CSN: [id number]     Arrival date & time 05/17/15  1914 History   First MD Initiated Contact with Patient 05/17/15 1926     Chief Complaint  Patient presents with  . Extremity Laceration     (Consider location/radiation/quality/duration/timing/severity/associated sxs/prior Treatment) The history is provided by the patient. No language interpreter was used.   Sherry Schultz is a 59 year old female with a history of diabetes, hypertension, hyperlipidemia, DVT and PE, and kidney stones who presents for laceration to the left palm while using a knife this morning. She states she covered it up and thought it would be okay, and went to work. She had her sister look out who is a Marine scientist and told her to come to the ED. Tetanus status not up-to-date. She denies any numbness or tingling to the hand. She is on Coumadin.  Past Medical History  Diagnosis Date  . Diabetes mellitus without complication   . Hypertension   . High cholesterol   . Kidney stone   . Breast cancer   . DVT (deep venous thrombosis)   . Pulmonary emboli    Past Surgical History  Procedure Laterality Date  . Breast surgery    . Left heart catheterization with coronary angiogram N/A 08/24/2013    Procedure: LEFT HEART CATHETERIZATION WITH CORONARY ANGIOGRAM;  Surgeon: Peter M Martinique, MD;  [facility] LAB;  Service: Cardiovascular;  Laterality: N/A;  . Mastectomy     Family History  Problem Relation Age of Onset  . Heart attack Father    Social History  Substance Use Topics  . Smoking status: Never Smoker   . Smokeless tobacco: None  . Alcohol Use: No   OB History    No data available     Review of Systems  Skin: Positive for wound.  Neurological: Negative for numbness.      Allergies  Codeine  Home Medications   Prior to Admission medications   Medication Sig Start Date End Date Taking? Authorizing Provider  atorvastatin (LIPITOR) 20 MG tablet Take 20 mg by mouth daily.    Historical Provider, MD   Calcium Carbonate-Vit D-Min (CALCIUM 1200 PO) Take 1,200 mg by mouth 2 (two) times daily.    Historical Provider, MD  cephALEXin (KEFLEX) 500 MG capsule Take 1 capsule (500 mg total) by mouth 2 (two) times daily. 05/17/15   Juda Lajeunesse Patel-Mills, PA-C  ibandronate (BONIVA) 150 MG tablet Take 150 mg by mouth every 30 (thirty) days.  06/06/13   Historical Provider, MD  losartan (COZAAR) 50 MG tablet Take 0.5 tablets (25 mg total) by mouth daily. 08/24/13   Dayna N Dunn, PA-C  metFORMIN (GLUCOPHAGE) 500 MG tablet Take 1 tablet (500 mg total) by mouth 2 (two) times daily with a meal. 08/26/13   Dayna N Dunn, PA-C  Multiple Vitamin (MULTIVITAMIN WITH MINERALS) TABS tablet Take 1 tablet by mouth daily.    Historical Provider, MD  omeprazole (PRILOSEC) 20 MG capsule Take 1 capsule (20 mg total) by mouth daily. 08/24/13   Dayna N Dunn, PA-C  PARoxetine (PAXIL) 20 MG tablet Take 20 mg by mouth daily.    Historical Provider, MD  warfarin (COUMADIN) 7.5 MG tablet Take 7.5 mg by mouth every evening.    Historical Provider, MD   BP 122/76 mmHg  Pulse 68  Temp(Src) 98.4 F (36.9 C) (Oral)  Resp 18  Ht 5\' 5"  (1.651 m)  Wt 170 lb (77.111 kg)  BMI 28.29 kg/m2  SpO2 98% Physical Exam  Constitutional: She  is oriented to person, place, and time. She appears well-developed and well-nourished.  HENT:  Head: Normocephalic and atraumatic.  Eyes: Conjunctivae are normal.  Neck: Neck supple.  Cardiovascular: Normal rate.   Pulmonary/Chest: Effort normal. No respiratory distress.  Musculoskeletal: Normal range of motion.  Neurological: She is alert and oriented to person, place, and time.  Skin: Skin is warm and dry.  Left flap laceration measuring approximately 2 cm on the palmar surface of the hand along the 5th metacarpal. Bleeding controlled. Able to flex and extend all fingers without difficulty. 2+ radial pulse. Less than 2 second capillary refill.  Nursing note and vitals reviewed.   ED Course  Procedures  (including critical care time) Labs Review Labs Reviewed - No data to display LACERATION REPAIR Performed by: Ottie Glazier Authorized by: Ottie Glazier Consent: Verbal consent obtained. Risks and benefits: risks, benefits and alternatives were discussed Consent given by: patient Patient identity confirmed: provided demographic data Prepped and Draped in normal sterile fashion Wound explored Laceration Location: left palmar surface of the hand Laceration Length: 2cm No Foreign Bodies seen or palpated Anesthesia: local infiltration Local anesthetic: lidocaine 2% with epinephrine Anesthetic total: 2 ml Irrigation method: syringe Amount of cleaning: standard Skin closure: 4-0 prolene Number of sutures: 6 Technique: single interrupted Patient tolerance: Patient tolerated the procedure well with no immediate complications.  Imaging Review No results found. I have personally reviewed and evaluated these images and lab results as part of my medical decision-making.   EKG Interpretation None      MDM   Final diagnoses:  Hand laceration, left, initial encounter  I discussed return precautions and follow up in 7-10 days for suture removal.  I also put the patient on Keflex due to the location of the injury and her co-morbidities.  She verbally agrees with the plan.  Medications  lidocaine-EPINEPHrine (XYLOCAINE W/EPI) 2 %-1:100000 (with pres) injection (not administered)  Tdap (BOOSTRIX) injection 0.5 mL (0.5 mLs Intramuscular Given 05/17/15 1935)        Ottie Glazier, PA-C 05/17/15 2025  Davonna Belling, MD 05/17/15 2322

## 2015-05-17 NOTE — ED Notes (Signed)
Cut left hand on knife this am while washing dishes-jagged lac noted-no bleeding

## 2015-05-17 NOTE — Discharge Instructions (Signed)
Laceration Care, Adult Have sutures removed in 7-10 days.  A laceration is a cut that goes through all layers of the skin. The cut goes into the tissue beneath the skin. HOME CARE For stitches (sutures) or staples:  Keep the cut clean and dry.  If you have a bandage (dressing), change it at least once a day. Change the bandage if it gets wet or dirty, or as told by your doctor.  Wash the cut with soap and water 2 times a day. Rinse the cut with water. Pat it dry with a clean towel.  Put a thin layer of medicated cream on the cut as told by your doctor.  You may shower after the first 24 hours. Do not soak the cut in water until the stitches are removed.  Only take medicines as told by your doctor.  Have your stitches or staples removed as told by your doctor. For skin adhesive strips:  Keep the cut clean and dry.  Do not get the strips wet. You may take a bath, but be careful to keep the cut dry.  If the cut gets wet, pat it dry with a clean towel.  The strips will fall off on their own. Do not remove the strips that are still stuck to the cut. For wound glue:  You may shower or take baths. Do not soak or scrub the cut. Do not swim. Avoid heavy sweating until the glue falls off on its own. After a shower or bath, pat the cut dry with a clean towel.  Do not put medicine on your cut until the glue falls off.  If you have a bandage, do not put tape over the glue.  Avoid lots of sunlight or tanning lamps until the glue falls off. Put sunscreen on the cut for the first year to reduce your scar.  The glue will fall off on its own. Do not pick at the glue. You may need a tetanus shot if:  You cannot remember when you had your last tetanus shot.  You have never had a tetanus shot. If you need a tetanus shot and you choose not to have one, you may get tetanus. Sickness from tetanus can be serious. GET HELP RIGHT AWAY IF:   Your pain does not get better with medicine.  Your  arm, hand, leg, or foot loses feeling (numbness) or changes color.  Your cut is bleeding.  Your joint feels weak, or you cannot use your joint.  You have painful lumps on your body.  Your cut is red, puffy (swollen), or painful.  You have a red line on the skin near the cut.  You have yellowish-white fluid (pus) coming from the cut.  You have a fever.  You have a bad smell coming from the cut or bandage.  Your cut breaks open before or after stitches are removed.  You notice something coming out of the cut, such as wood or glass.  You cannot move a finger or toe. MAKE SURE YOU:   Understand these instructions.  Will watch your condition.  Will get help right away if you are not doing well or get worse. Document Released: 03/04/2008 Document Revised: 12/09/2011 Document Reviewed: 03/12/2011 [facility] Patient Information 2015 [place], [place]. This information is not intended to replace advice given to you by your health care provider. Make sure you discuss any questions you have with your health care provider.

## 2016-05-15 ENCOUNTER — Other Ambulatory Visit: Payer: Self-pay | Admitting: Internal Medicine

## 2016-05-15 DIAGNOSIS — Z1231 Encounter for screening mammogram for malignant neoplasm of breast: Secondary | ICD-10-CM

## 2016-05-20 ENCOUNTER — Ambulatory Visit: Payer: 59

## 2016-05-30 ENCOUNTER — Ambulatory Visit
Admission: RE | Admit: 2016-05-30 | Discharge: 2016-05-30 | Disposition: A | Discharge status: Home / Self Care | Payer: 59 | Source: Ambulatory Visit | Attending: Internal Medicine | Admitting: Internal Medicine

## 2016-05-30 DIAGNOSIS — Z1231 Encounter for screening mammogram for malignant neoplasm of breast: Secondary | ICD-10-CM

## 2016-11-12 ENCOUNTER — Encounter (HOSPITAL_BASED_OUTPATIENT_CLINIC_OR_DEPARTMENT_OTHER): Payer: Self-pay | Admitting: Respiratory Therapy

## 2016-11-12 ENCOUNTER — Emergency Department (HOSPITAL_BASED_OUTPATIENT_CLINIC_OR_DEPARTMENT_OTHER)
Admission: EM | Admit: 2016-11-12 | Discharge: 2016-11-13 | Disposition: A | Discharge status: Home / Self Care | Payer: 59 | Attending: Emergency Medicine | Admitting: Emergency Medicine

## 2016-11-12 DIAGNOSIS — S61452A Open bite of left hand, initial encounter: Secondary | ICD-10-CM | POA: Insufficient documentation

## 2016-11-12 DIAGNOSIS — Z853 Personal history of malignant neoplasm of breast: Secondary | ICD-10-CM | POA: Diagnosis not present

## 2016-11-12 DIAGNOSIS — Z79899 Other long term (current) drug therapy: Secondary | ICD-10-CM | POA: Insufficient documentation

## 2016-11-12 DIAGNOSIS — Z7901 Long term (current) use of anticoagulants: Secondary | ICD-10-CM | POA: Diagnosis not present

## 2016-11-12 DIAGNOSIS — Z23 Encounter for immunization: Secondary | ICD-10-CM | POA: Insufficient documentation

## 2016-11-12 DIAGNOSIS — Y939 Activity, unspecified: Secondary | ICD-10-CM | POA: Insufficient documentation

## 2016-11-12 DIAGNOSIS — Y999 Unspecified external cause status: Secondary | ICD-10-CM | POA: Insufficient documentation

## 2016-11-12 DIAGNOSIS — E119 Type 2 diabetes mellitus without complications: Secondary | ICD-10-CM | POA: Diagnosis not present

## 2016-11-12 DIAGNOSIS — Y929 Unspecified place or not applicable: Secondary | ICD-10-CM | POA: Diagnosis not present

## 2016-11-12 DIAGNOSIS — W540XXA Bitten by dog, initial encounter: Secondary | ICD-10-CM | POA: Insufficient documentation

## 2016-11-12 DIAGNOSIS — Z7984 Long term (current) use of oral hypoglycemic drugs: Secondary | ICD-10-CM | POA: Insufficient documentation

## 2016-11-12 DIAGNOSIS — I10 Essential (primary) hypertension: Secondary | ICD-10-CM | POA: Insufficient documentation

## 2016-11-12 MED ORDER — SODIUM CHLORIDE 0.9 % IV SOLN
3.0000 g | Freq: Once | INTRAVENOUS | Status: AC
  Administered 2016-11-13: 3 g via INTRAVENOUS
  Filled 2016-11-12: qty 3

## 2016-11-12 MED ORDER — AMOXICILLIN-POT CLAVULANATE 875-125 MG PO TABS
1.0000 | ORAL_TABLET | Freq: Once | ORAL | Status: AC
  Administered 2016-11-12: 1 via ORAL
  Filled 2016-11-12: qty 1

## 2016-11-12 MED ORDER — TETANUS-DIPHTH-ACELL PERTUSSIS 5-2.5-18.5 LF-MCG/0.5 IM SUSP
0.5000 mL | Freq: Once | INTRAMUSCULAR | Status: AC
Start: 2016-11-12 — End: 2016-11-12
  Administered 2016-11-12: 0.5 mL via INTRAMUSCULAR
  Filled 2016-11-12: qty 0.5

## 2016-11-12 NOTE — ED Notes (Signed)
Animal control form complete and faxed to animal control.

## 2016-11-12 NOTE — ED Triage Notes (Signed)
Pt sustained dog bite by her dog yesterday. Pt now has redness, swelling and increased pain in right hand.

## 2016-11-12 NOTE — ED Provider Notes (Signed)
[place] DEPT [facility] Provider Note   CSN: [id number] Arrival date & time: 11/12/16  2215  By signing my name below, I, Higinio Plan, attest that this documentation has been prepared under the direction and in the presence of Everlene Balls, MD . Electronically Signed: Higinio Plan, Scribe. 11/12/2016. 11:21 PM.  History   Chief Complaint Chief Complaint  Patient presents with  . Animal Bite   The history is provided by the patient. No language interpreter was used.   HPI Comments: Sherry Schultz is a 61 y.o. female who presents to the Emergency Department complaining of gradually worsening, left hand pain s/p an animal bite that occurred last night. Pt reports she sustained a dog bite by her own dog yesterday and noticed associated redness, swelling and increased pain to her hand today. She states her pain is now radiating up her left arm in the ED. She notes her dog is up to date on all of its vaccinations. Pt denies fever and any other complaints.   Past Medical History:  Diagnosis Date  . Breast cancer ([place])   . Diabetes mellitus without complication ([place])   . DVT (deep venous thrombosis) ([place])   . High cholesterol   . Hypertension   . Kidney stone   . Pulmonary emboli [facility])     Patient Active Problem List   Diagnosis Date Noted  . Chest pain 08/22/2013  . Essential hypertension 08/22/2013  . T2DM (type 2 diabetes mellitus) ([place]) 08/22/2013  . Dyslipidemia 08/22/2013    Past Surgical History:  Procedure Laterality Date  . BREAST SURGERY    . LEFT HEART CATHETERIZATION WITH CORONARY ANGIOGRAM N/A 08/24/2013   Procedure: LEFT HEART CATHETERIZATION WITH CORONARY ANGIOGRAM;  Surgeon: Peter M Martinique, MD;  Location: [facility] CATH LAB;  Service: Cardiovascular;  Laterality: N/A;  . MASTECTOMY      OB History    No data available       Home Medications    Prior to Admission medications   Medication Sig Start Date End Date Taking? Authorizing Provider  atorvastatin  (LIPITOR) 20 MG tablet Take 20 mg by mouth daily.    Historical Provider, MD  Calcium Carbonate-Vit D-Min (CALCIUM 1200 PO) Take 1,200 mg by mouth 2 (two) times daily.    Historical Provider, MD  cephALEXin (KEFLEX) 500 MG capsule Take 1 capsule (500 mg total) by mouth 2 (two) times daily. 05/17/15   Hanna Patel-Mills, PA-C  ibandronate (BONIVA) 150 MG tablet Take 150 mg by mouth every 30 (thirty) days.  06/06/13   Historical Provider, MD  losartan (COZAAR) 50 MG tablet Take 0.5 tablets (25 mg total) by mouth daily. 08/24/13   Dayna N Dunn, PA-C  metFORMIN (GLUCOPHAGE) 500 MG tablet Take 1 tablet (500 mg total) by mouth 2 (two) times daily with a meal. 08/26/13   Dayna N Dunn, PA-C  Multiple Vitamin (MULTIVITAMIN WITH MINERALS) TABS tablet Take 1 tablet by mouth daily.    Historical Provider, MD  omeprazole (PRILOSEC) 20 MG capsule Take 1 capsule (20 mg total) by mouth daily. 08/24/13   Dayna N Dunn, PA-C  PARoxetine (PAXIL) 20 MG tablet Take 20 mg by mouth daily.    Historical Provider, MD  warfarin (COUMADIN) 7.5 MG tablet Take 7.5 mg by mouth every evening.    Historical Provider, MD    Family History Family History  Problem Relation Age of Onset  . Heart attack Father     Social History Social History  Substance Use Topics  .  Smoking status: Never Smoker  . Smokeless tobacco: Never Used  . Alcohol use No   Allergies   Codeine  Review of Systems Review of Systems  Constitutional: Negative for fever.  Skin: Positive for color change and wound.  All other systems reviewed and are negative.  Physical Exam Updated Vital Signs BP 153/61 (BP Location: Right Arm)   Pulse 73   Temp 98.6 F (37 C) (Oral)   Resp 16   Ht 5\' 5"  (1.651 m)   Wt 165 lb (74.8 kg)   SpO2 99%   BMI 27.46 kg/m   Physical Exam  Constitutional: She is oriented to person, place, and time. She appears well-developed and well-nourished. No distress.  HENT:  Head: Normocephalic and atraumatic.  Nose: Nose  normal.  Mouth/Throat: Oropharynx is clear and moist. No oropharyngeal exudate.  Eyes: Conjunctivae and EOM are normal. Pupils are equal, round, and reactive to light. No scleral icterus.  Neck: Normal range of motion. Neck supple. No JVD present. No tracheal deviation present. No thyromegaly present.  Cardiovascular: Normal rate, regular rhythm and normal heart sounds.  Exam reveals no gallop and no friction rub.   No murmur heard. Pulmonary/Chest: Effort normal and breath sounds normal. No respiratory distress. She has no wheezes. She exhibits no tenderness.  Abdominal: Soft. Bowel sounds are normal. She exhibits no distension and no mass. There is no tenderness. There is no rebound and no guarding.  Musculoskeletal: Normal range of motion. She exhibits tenderness. She exhibits no edema.  Left hand has diffuse swelling over the dorsal surface and palm, mild TTP, and erythema over first four digits. No significant streaking seen up the arm.   Lymphadenopathy:    She has no cervical adenopathy.  Neurological: She is alert and oriented to person, place, and time. No cranial nerve deficit. She exhibits normal muscle tone.  Skin: Skin is warm and dry. No rash noted. No erythema. No pallor.  Nursing note and vitals reviewed.  ED Treatments / Results  DIAGNOSTIC STUDIES:  Oxygen Saturation is 99% on RA, normal by my interpretation.    COORDINATION OF CARE:  11:20 PM Discussed treatment plan with pt at bedside and pt agreed to plan.  Labs (all labs ordered are listed, but only abnormal results are displayed) Labs Reviewed - No data to display  EKG  EKG Interpretation None      Radiology No results found.  Procedures Procedures (including critical care time)  Medications Ordered in ED Medications - No data to display  Initial Impression / Assessment and Plan / ED Course  I have reviewed the triage vital signs and the nursing notes.  Pertinent labs & imaging results that were  available during my care of the patient were reviewed by me and considered in my medical decision making (see chart for details).     Patient presents to the ED for animal bite.  Her hand is acutely infected.  Tetanus shot given.  Patient can likely go home on oral abx with hand surgery follow up. Will consult Dr. Amedeo Plenty as well.  12:09 AM HE advises to give IV unasyn as well which was done.  Follow up in clinic tomorrow at 11am.  She appeared hesitant with this and states she has to work tomorrow.  I stressed the importance of this appt and she demonstrates good understanding and plans to be there.  Rx for augmentin given for 1 week.  She appears well and in NAD.  VS remain within her normal  limits and she is safe for DC.   I personally performed the services described in this documentation, which was scribed in my presence. The recorded information has been reviewed and is accurate.     Final Clinical Impressions(s) / ED Diagnoses   Final diagnoses:  None    New Prescriptions New Prescriptions   No medications on file     Everlene Balls, MD 11/13/16 0010

## 2016-11-13 MED ORDER — AMOXICILLIN-POT CLAVULANATE 875-125 MG PO TABS: 1.0000 | ORAL_TABLET | Freq: Two times a day (BID) | ORAL | 0 refills | Status: DC

## 2016-11-13 NOTE — ED Notes (Signed)
Patient denies any SOB or itching

## 2017-03-01 ENCOUNTER — Emergency Department (HOSPITAL_BASED_OUTPATIENT_CLINIC_OR_DEPARTMENT_OTHER)
Admission: EM | Admit: 2017-03-01 | Discharge: 2017-03-01 | Disposition: A | Discharge status: Home / Self Care | Payer: 59 | Attending: Emergency Medicine | Admitting: Emergency Medicine

## 2017-03-01 ENCOUNTER — Encounter (HOSPITAL_BASED_OUTPATIENT_CLINIC_OR_DEPARTMENT_OTHER): Payer: Self-pay | Admitting: *Deleted

## 2017-03-01 ENCOUNTER — Emergency Department (HOSPITAL_BASED_OUTPATIENT_CLINIC_OR_DEPARTMENT_OTHER): Payer: 59

## 2017-03-01 DIAGNOSIS — Z7901 Long term (current) use of anticoagulants: Secondary | ICD-10-CM | POA: Insufficient documentation

## 2017-03-01 DIAGNOSIS — K602 Anal fissure, unspecified: Secondary | ICD-10-CM | POA: Diagnosis not present

## 2017-03-01 DIAGNOSIS — I10 Essential (primary) hypertension: Secondary | ICD-10-CM | POA: Diagnosis not present

## 2017-03-01 DIAGNOSIS — E119 Type 2 diabetes mellitus without complications: Secondary | ICD-10-CM | POA: Insufficient documentation

## 2017-03-01 DIAGNOSIS — R195 Other fecal abnormalities: Secondary | ICD-10-CM | POA: Diagnosis present

## 2017-03-01 DIAGNOSIS — E876 Hypokalemia: Secondary | ICD-10-CM | POA: Diagnosis not present

## 2017-03-01 DIAGNOSIS — N83202 Unspecified ovarian cyst, left side: Secondary | ICD-10-CM

## 2017-03-01 DIAGNOSIS — K529 Noninfective gastroenteritis and colitis, unspecified: Secondary | ICD-10-CM

## 2017-03-01 DIAGNOSIS — Z853 Personal history of malignant neoplasm of breast: Secondary | ICD-10-CM | POA: Insufficient documentation

## 2017-03-01 LAB — CBC WITH DIFFERENTIAL/PLATELET
BASOS ABS: 0 10*3/uL (ref 0.0–0.1)
Basophils Relative: 0 %
Eosinophils Absolute: 0.3 10*3/uL (ref 0.0–0.7)
Eosinophils Relative: 6 %
HEMATOCRIT: 38.6 % (ref 36.0–46.0)
Hemoglobin: 12.9 g/dL (ref 12.0–15.0)
LYMPHS PCT: 32 %
Lymphs Abs: 1.5 10*3/uL (ref 0.7–4.0)
MCH: 33.2 pg (ref 26.0–34.0)
MCHC: 33.4 g/dL (ref 30.0–36.0)
MCV: 99.2 fL (ref 78.0–100.0)
Monocytes Absolute: 1 10*3/uL (ref 0.1–1.0)
Monocytes Relative: 22 %
NEUTROS ABS: 1.9 10*3/uL (ref 1.7–7.7)
NEUTROS PCT: 40 %
Platelets: 168 10*3/uL (ref 150–400)
RBC: 3.89 MIL/uL (ref 3.87–5.11)
RDW: 12.7 % (ref 11.5–15.5)
WBC: 4.7 10*3/uL (ref 4.0–10.5)

## 2017-03-01 LAB — PROTIME-INR
INR: 1.01
Prothrombin Time: 13.3 seconds (ref 11.4–15.2)

## 2017-03-01 LAB — BASIC METABOLIC PANEL
ANION GAP: 8 (ref 5–15)
BUN: 19 mg/dL (ref 6–20)
CO2: 25 mmol/L (ref 22–32)
Calcium: 8.9 mg/dL (ref 8.9–10.3)
Chloride: 105 mmol/L (ref 101–111)
Creatinine, Ser: 0.86 mg/dL (ref 0.44–1.00)
GFR calc Af Amer: >60 mL/min (ref >60)
Glucose, Bld: 152 mg/dL — ABNORMAL HIGH (ref 65–99)
POTASSIUM: 3 mmol/L — AB (ref 3.5–5.1)
SODIUM: 138 mmol/L (ref 135–145)

## 2017-03-01 MED ORDER — SODIUM CHLORIDE 0.9 % IV BOLUS (SEPSIS)
1000.0000 mL | Freq: Once | INTRAVENOUS | Status: AC
  Administered 2017-03-01: 1000 mL via INTRAVENOUS

## 2017-03-01 MED ORDER — DICYCLOMINE HCL 20 MG PO TABS: 20.0000 mg | ORAL_TABLET | Freq: Two times a day (BID) | ORAL | 0 refills | Status: DC

## 2017-03-01 MED ORDER — CIPROFLOXACIN HCL 500 MG PO TABS: 500.0000 mg | ORAL_TABLET | Freq: Two times a day (BID) | ORAL | 0 refills | Status: DC

## 2017-03-01 MED ORDER — IOPAMIDOL (ISOVUE-300) INJECTION 61%
100.0000 mL | Freq: Once | INTRAVENOUS | Status: AC | PRN
  Administered 2017-03-01: 100 mL via INTRAVENOUS

## 2017-03-01 MED ORDER — DICYCLOMINE HCL 10 MG PO CAPS
10.0000 mg | ORAL_CAPSULE | Freq: Once | ORAL | Status: AC
  Administered 2017-03-01: 10 mg via ORAL
  Filled 2017-03-01: qty 1

## 2017-03-01 MED ORDER — METRONIDAZOLE 500 MG PO TABS: 500.0000 mg | ORAL_TABLET | Freq: Two times a day (BID) | ORAL | 0 refills | Status: DC

## 2017-03-01 MED ORDER — POTASSIUM CHLORIDE CRYS ER 20 MEQ PO TBCR: 40.0000 meq | EXTENDED_RELEASE_TABLET | Freq: Once | ORAL | 0 refills | Status: DC

## 2017-03-01 MED ORDER — POTASSIUM CHLORIDE CRYS ER 20 MEQ PO TBCR
40.0000 meq | EXTENDED_RELEASE_TABLET | Freq: Once | ORAL | Status: AC
  Administered 2017-03-01: 40 meq via ORAL
  Filled 2017-03-01: qty 2

## 2017-03-01 NOTE — ED Provider Notes (Signed)
[place] DEPT [facility] Provider Note   CSN: [id number] Arrival date & time: 03/01/17  1332     History   Chief Complaint Chief Complaint  Patient presents with  . Blood In Stools    HPI Sherry Schultz is a 61 y.o. female with a past medical history of breast cancer and DVT who presents emergency Department with chief complaint of diarrhea and rectal bleeding. She was sent in by her physician from urgent care, whom she saw earlier. The patient states that she has had 6 days of brown watery diarrhea at least 6 times a day. She has associated fatigue and dizziness with standing. She denies any nausea or vomiting. She complains of diffuse abdominal pain, worse in the bilateral lower quadrants of her abdomen. The patient states that yesterday she noticed pink tinged stool and blood on her toilet paper when she would wipe. She has not been passing clots or had melena or hematochezia. She states that the doctor at the urgent care thought she might have a fissure. She denies pain in her anus or rectum when defecating.  HPI  Past Medical History:  Diagnosis Date  . Breast cancer ([place])   . Diabetes mellitus without complication ([place])   . DVT (deep venous thrombosis) ([place])   . High cholesterol   . Hypertension   . Kidney stone   . Pulmonary emboli [facility])     Patient Active Problem List   Diagnosis Date Noted  . Chest pain 08/22/2013  . Essential hypertension 08/22/2013  . T2DM (type 2 diabetes mellitus) ([place]) 08/22/2013  . Dyslipidemia 08/22/2013    Past Surgical History:  Procedure Laterality Date  . BREAST SURGERY    . LEFT HEART CATHETERIZATION WITH CORONARY ANGIOGRAM N/A 08/24/2013   Procedure: LEFT HEART CATHETERIZATION WITH CORONARY ANGIOGRAM;  Surgeon: Peter M Martinique, MD;  Location: [facility] C[facility] LAB;  Service: Cardiovascular;  Laterality: N/A;  . MASTECTOMY      OB History    No data available       Home Medications    Prior to Admission medications   Medication Sig  Start Date End Date Taking? Authorizing Provider  atorvastatin (LIPITOR) 20 MG tablet Take 20 mg by mouth daily.   Yes [provider]  Calcium Carbonate-Vit D-Min (CALCIUM 1200 PO) Take 1,200 mg by mouth 2 (two) times daily.   Yes [provider]  ibandronate (BONIVA) 150 MG tablet Take 150 mg by mouth every 30 (thirty) days.  06/06/13  Yes [provider]  losartan (COZAAR) 50 MG tablet Take 0.5 tablets (25 mg total) by mouth daily. 08/24/13  Yes Dunn, Dayna N, PA-C  metFORMIN (GLUCOPHAGE) 500 MG tablet Take 1 tablet (500 mg total) by mouth 2 (two) times daily with a meal. 08/26/13  Yes Dunn, Dayna N, PA-C  Multiple Vitamin (MULTIVITAMIN WITH MINERALS) TABS tablet Take 1 tablet by mouth daily.   Yes [provider]  omeprazole (PRILOSEC) 20 MG capsule Take 1 capsule (20 mg total) by mouth daily. 08/24/13  Yes Dunn, Dayna N, PA-C  PARoxetine (PAXIL) 20 MG tablet Take 20 mg by mouth daily.   Yes [provider]  amoxicillin-clavulanate (AUGMENTIN) 875-125 MG tablet Take 1 tablet by mouth 2 (two) times daily. One po bid x 7 days 11/13/16   Everlene Balls, MD  cephALEXin (KEFLEX) 500 MG capsule Take 1 capsule (500 mg total) by mouth 2 (two) times daily. 05/17/15   Patel-Mills, Orvil Feil, PA-C  warfarin (COUMADIN) 7.5 MG tablet Take 7.5  mg by mouth every evening.    [provider]    Family History Family History  Problem Relation Age of Onset  . Heart attack Father     Social History Social History  Substance Use Topics  . Smoking status: Never Smoker  . Smokeless tobacco: Never Used  . Alcohol use No     Allergies   Codeine   Review of Systems Review of Systems Ten systems reviewed and are negative for acute change, except as noted in the HPI.    Physical Exam Updated Vital Signs BP 119/70 (BP Location: Right Arm)   Pulse 74   Temp 98.2 F (36.8 C) (Oral)   Resp 16   Ht 5\' 5"  (1.651 m)   Wt 74.8 kg (165 lb)   SpO2 97%   BMI  27.46 kg/m   Physical Exam  Constitutional: She is oriented to person, place, and time. She appears well-developed and well-nourished. No distress.  HENT:  Head: Normocephalic and atraumatic.  Eyes: Conjunctivae are normal. No scleral icterus.  Neck: Normal range of motion.  Cardiovascular: Normal rate, regular rhythm and normal heart sounds.  Exam reveals no gallop and no friction rub.   No murmur heard. Pulmonary/Chest: Effort normal and breath sounds normal. No respiratory distress.  Abdominal: Soft. Bowel sounds are normal. She exhibits no distension and no mass. There is tenderness. There is guarding.  Neurological: She is alert and oriented to person, place, and time.  Skin: Skin is warm and dry. She is not diaphoretic.  Psychiatric: Her behavior is normal.  Nursing note and vitals reviewed.    ED Treatments / Results  Labs (all labs ordered are listed, but only abnormal results are displayed) Labs Reviewed  BASIC METABOLIC PANEL  CBC WITH DIFFERENTIAL/PLATELET  PROTIME-INR    EKG  EKG Interpretation None       Radiology No results found.  Procedures Procedures (including critical care time)  Medications Ordered in ED Medications - No data to display   Initial Impression / Assessment and Plan / ED Course  I have reviewed the triage vital signs and the nursing notes.  Pertinent labs & imaging results that were available during my care of the patient were reviewed by me and considered in my medical decision making (see chart for details).     Patient with confirmed colitis on her CT scan. We are still currently awaiting a stool sample. Patient noted to be hypokalemic 23. I have given her ordered. Oral repletion with 40 mEq. Patient is also receiving IV fluids. She will be discharged with Cipro and Flagyl. I have talked to her about bowel rest. She will also have a repeat dose of potassium. The patient's CT scan was also remarkable for a left ovarian cyst.  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 I have advised the patient to follow closely for any in outpatient ultrasound. Her sister, who is an oncology nurse is present and assured that she would have close follow-up. The patient appears safe for discharge. I discussed return precautions.  Final Clinical Impressions(s) / ED Diagnoses   Final diagnoses:  None    New Prescriptions New Prescriptions   No medications on file     Margarita Mail, PA-C 03/01/17 1656    Alfonzo Beers, MD 03/02/17 [phone]

## 2017-03-01 NOTE — ED Triage Notes (Signed)
Pt reports diarrhea x4days. Reports being sent by Urgent Care d/t positive hemoccult. Reports fever of 99.9 and body aches. Denies n/v.

## 2017-03-01 NOTE — Discharge Instructions (Signed)
Get help right away if: You have a fever that does not go away with treatment. You develop chills. You have extreme weakness, fainting, or dehydration. You have repeated vomiting. You develop severe pain in your abdomen. You pass bloody or tarry stool.

## 2017-03-02 LAB — C DIFFICILE QUICK SCREEN W PCR REFLEX
C Diff antigen: NEGATIVE
C Diff interpretation: NOT DETECTED
C Diff toxin: NEGATIVE

## 2017-03-02 NOTE — ED Provider Notes (Signed)
Signout from Norris, PA-C at shift change  Discharge patient home after fluids and after patient able to give stool sample for C. difficile testing. Patient to be discharged with Cipro and Flagyl.  Patient discharged after fluids and was able to provide stool sample. C. difficile testing pending.   Frederica Kuster, PA-C 03/02/17 0018    Charlesetta Shanks, MD 03/06/17 [phone]

## 2017-05-29 ENCOUNTER — Other Ambulatory Visit: Payer: Self-pay | Admitting: Internal Medicine

## 2017-05-29 DIAGNOSIS — Z1231 Encounter for screening mammogram for malignant neoplasm of breast: Secondary | ICD-10-CM

## 2017-06-12 ENCOUNTER — Ambulatory Visit
Admission: RE | Admit: 2017-06-12 | Discharge: 2017-06-12 | Disposition: A | Discharge status: Home / Self Care | Payer: 59 | Source: Ambulatory Visit | Attending: Internal Medicine | Admitting: Internal Medicine

## 2017-06-12 DIAGNOSIS — Z1231 Encounter for screening mammogram for malignant neoplasm of breast: Secondary | ICD-10-CM

## 2017-09-21 ENCOUNTER — Emergency Department (HOSPITAL_BASED_OUTPATIENT_CLINIC_OR_DEPARTMENT_OTHER): Payer: 59

## 2017-09-21 ENCOUNTER — Encounter (HOSPITAL_BASED_OUTPATIENT_CLINIC_OR_DEPARTMENT_OTHER): Payer: Self-pay | Admitting: Emergency Medicine

## 2017-09-21 ENCOUNTER — Emergency Department (HOSPITAL_BASED_OUTPATIENT_CLINIC_OR_DEPARTMENT_OTHER)
Admission: EM | Admit: 2017-09-21 | Discharge: 2017-09-21 | Disposition: A | Discharge status: Home / Self Care | Payer: 59 | Attending: Emergency Medicine | Admitting: Emergency Medicine

## 2017-09-21 ENCOUNTER — Other Ambulatory Visit: Payer: Self-pay

## 2017-09-21 DIAGNOSIS — S61452A Open bite of left hand, initial encounter: Secondary | ICD-10-CM

## 2017-09-21 DIAGNOSIS — Z7984 Long term (current) use of oral hypoglycemic drugs: Secondary | ICD-10-CM | POA: Insufficient documentation

## 2017-09-21 DIAGNOSIS — W540XXA Bitten by dog, initial encounter: Secondary | ICD-10-CM | POA: Diagnosis not present

## 2017-09-21 DIAGNOSIS — Y998 Other external cause status: Secondary | ICD-10-CM | POA: Insufficient documentation

## 2017-09-21 DIAGNOSIS — E119 Type 2 diabetes mellitus without complications: Secondary | ICD-10-CM | POA: Insufficient documentation

## 2017-09-21 DIAGNOSIS — I10 Essential (primary) hypertension: Secondary | ICD-10-CM | POA: Diagnosis not present

## 2017-09-21 DIAGNOSIS — Z7901 Long term (current) use of anticoagulants: Secondary | ICD-10-CM | POA: Insufficient documentation

## 2017-09-21 DIAGNOSIS — S61412A Laceration without foreign body of left hand, initial encounter: Secondary | ICD-10-CM | POA: Diagnosis present

## 2017-09-21 DIAGNOSIS — Z853 Personal history of malignant neoplasm of breast: Secondary | ICD-10-CM | POA: Diagnosis not present

## 2017-09-21 DIAGNOSIS — Y929 Unspecified place or not applicable: Secondary | ICD-10-CM | POA: Diagnosis not present

## 2017-09-21 DIAGNOSIS — Y939 Activity, unspecified: Secondary | ICD-10-CM | POA: Diagnosis not present

## 2017-09-21 MED ORDER — LIDOCAINE HCL (PF) 1 % IJ SOLN: 30.0000 mL | Freq: Once | INTRAMUSCULAR | Status: DC

## 2017-09-21 MED ORDER — CEPHALEXIN 250 MG PO CAPS
500.0000 mg | ORAL_CAPSULE | Freq: Once | ORAL | Status: AC
  Administered 2017-09-21: 500 mg via ORAL
  Filled 2017-09-21: qty 2

## 2017-09-21 MED ORDER — LIDOCAINE HCL 1 % IJ SOLN
INTRAMUSCULAR | Status: AC
  Administered 2017-09-21: 20 mL
  Filled 2017-09-21: qty 20

## 2017-09-21 MED ORDER — CEPHALEXIN 500 MG PO CAPS: 500.0000 mg | ORAL_CAPSULE | Freq: Four times a day (QID) | ORAL | 0 refills | Status: DC

## 2017-09-21 NOTE — ED Provider Notes (Signed)
..  Laceration Repair Date/Time: 09/21/2017 9:10 AM Performed by: Lorin Glass, PA-C Authorized by: Lorin Glass, PA-C   Consent:    Consent obtained:  Verbal   Consent given by:  Patient   Risks discussed:  Infection, pain, poor cosmetic result, poor wound healing, retained foreign body and need for additional repair (That any closure may put her at increased risk for infection. )   Alternatives discussed: Antibiotics, no skin closure. Anesthesia (see MAR for exact dosages):    Anesthesia method:  Local infiltration   Local anesthetic:  Lidocaine 2% w/o epi Laceration details:    Location: Between left 3rd/4th fingers web space.   Length (cm):  2 Repair type:    Repair type:  Simple Pre-procedure details:    Preparation:  Patient was prepped and draped in usual sterile fashion and imaging obtained to evaluate for foreign bodies Exploration:    Hemostasis achieved with:  Direct pressure   Wound exploration: wound explored through full range of motion and entire depth of wound probed and visualized     Wound extent: no foreign bodies/material noted     Contaminated: yes   Treatment:    Area cleansed with:  Betadine, Hibiclens and saline   Amount of cleaning:  Extensive   Irrigation solution:  Sterile water   Irrigation volume:  1 liter   Irrigation method:  Pressure wash Skin repair:    Repair method:  Sutures   Suture size:  5-0   Suture material:  Prolene   Suture technique:  Simple interrupted   Number of sutures:  2 Approximation:    Approximation:  Loose Post-procedure details:    Dressing:  Antibiotic ointment, bulky dressing, non-adherent dressing and sterile dressing   Patient tolerance of procedure:  Tolerated well, no immediate complications Comments:     Wound was closed very loosely, wound communicated with multiple other small wounds subcutaneously     Please see note by Dr. Gilford Raid for full note, H/P/plan.    Lorin Glass,  PA-C 09/21/17 1644    Isla Pence, MD 09/25/17 [phone]

## 2017-09-21 NOTE — ED Provider Notes (Signed)
[place] EMERGENCY DEPARTMENT Provider Note   CSN: [id number] Arrival date & time: 09/21/17  0938     History   Chief Complaint Chief Complaint  Patient presents with  . Animal Bite    HPI Sherry Schultz is a 61 y.o. female.  Pt presents to the ED today with a dog bite to her left hand.  The pt said she thinks her Beagle got scared and bit her.  Her dog is utd on his shots.  She is utd on tetanus.        Past Medical History:  Diagnosis Date  . Breast cancer ([place])   . Diabetes mellitus without complication ([place])   . DVT (deep venous thrombosis) ([place])   . High cholesterol   . Hypertension   . Kidney stone   . Pulmonary emboli [facility])     Patient Active Problem List   Diagnosis Date Noted  . Chest pain 08/22/2013  . Essential hypertension 08/22/2013  . T2DM (type 2 diabetes mellitus) ([place]) 08/22/2013  . Dyslipidemia 08/22/2013    Past Surgical History:  Procedure Laterality Date  . BREAST SURGERY  2005   left mastectomy  . LEFT HEART CATHETERIZATION WITH CORONARY ANGIOGRAM N/A 08/24/2013   Procedure: LEFT HEART CATHETERIZATION WITH CORONARY ANGIOGRAM;  Surgeon: Peter M Martinique, MD;  Location: [facility] CATH LAB;  Service: Cardiovascular;  Laterality: N/A;  . MASTECTOMY     left    OB History    No data available       Home Medications    Prior to Admission medications   Medication Sig Start Date End Date Taking? Authorizing Provider  atorvastatin (LIPITOR) 20 MG tablet Take 20 mg by mouth daily.   Yes [provider]  Calcium Carbonate-Vit D-Min (CALCIUM 1200 PO) Take 1,200 mg by mouth 2 (two) times daily.   Yes [provider]  losartan (COZAAR) 50 MG tablet Take 0.5 tablets (25 mg total) by mouth daily. 08/24/13  Yes Dunn, Dayna N, PA-C  metFORMIN (GLUCOPHAGE) 500 MG tablet Take 1 tablet (500 mg total) by mouth 2 (two) times daily with a meal. 08/26/13  Yes Dunn, Dayna N, PA-C  Multiple Vitamin (MULTIVITAMIN WITH MINERALS)  TABS tablet Take 1 tablet by mouth daily.   Yes [provider]  omeprazole (PRILOSEC) 20 MG capsule Take 1 capsule (20 mg total) by mouth daily. 08/24/13  Yes Dunn, Dayna N, PA-C  PARoxetine (PAXIL) 20 MG tablet Take 20 mg by mouth daily.   Yes [provider]  amoxicillin-clavulanate (AUGMENTIN) 875-125 MG tablet Take 1 tablet by mouth 2 (two) times daily. One po bid x 7 days 11/13/16   Everlene Balls, MD  cephALEXin (KEFLEX) 500 MG capsule Take 1 capsule (500 mg total) by mouth 4 (four) times daily. 09/21/17   Isla Pence, MD  ciprofloxacin (CIPRO) 500 MG tablet Take 1 tablet (500 mg total) by mouth every 12 (twelve) hours. 03/01/17   Margarita Mail, PA-C  dicyclomine (BENTYL) 20 MG tablet Take 1 tablet (20 mg total) by mouth 2 (two) times daily. 03/01/17   Harris, Abigail, PA-C  ibandronate (BONIVA) 150 MG tablet Take 150 mg by mouth every 30 (thirty) days.  06/06/13   [provider]  metroNIDAZOLE (FLAGYL) 500 MG tablet Take 1 tablet (500 mg total) by mouth 2 (two) times daily. 03/01/17   Harris, Vernie Shanks, PA-C  potassium chloride SA (K-DUR,KLOR-CON) 20 MEQ tablet Take 2 tablets (40 mEq total) by mouth once. 03/01/17 03/01/17  Margarita Mail,  PA-C  warfarin (COUMADIN) 7.5 MG tablet Take 7.5 mg by mouth every evening.    [provider]    Family History Family History  Problem Relation Age of Onset  . Breast cancer Mother   . Heart attack Father     Social History Social History   Tobacco Use  . Smoking status: Never Smoker  . Smokeless tobacco: Never Used  Substance Use Topics  . Alcohol use: No  . Drug use: No     Allergies   Codeine   Review of Systems Review of Systems  Skin: Positive for wound.  All other systems reviewed and are negative.    Physical Exam Updated Vital Signs BP 116/63 (BP Location: Right Arm)   Pulse 74   Temp 98.7 F (37.1 C) (Oral)   Resp 18   Ht 5\' 5"  (1.651 m)   Wt 77.1 kg (170 lb)   SpO2 98%   BMI 28.29  kg/m   Physical Exam  Constitutional: She is oriented to person, place, and time. She appears well-developed and well-nourished.  HENT:  Head: Normocephalic and atraumatic.  Right Ear: External ear normal.  Left Ear: External ear normal.  Nose: Nose normal.  Mouth/Throat: Oropharynx is clear and moist.  Eyes: Conjunctivae and EOM are normal. Pupils are equal, round, and reactive to light.  Neck: Normal range of motion. Neck supple.  Cardiovascular: Normal rate, regular rhythm, normal heart sounds and intact distal pulses.  Pulmonary/Chest: Effort normal and breath sounds normal.  Abdominal: Soft. Bowel sounds are normal.  Musculoskeletal: Normal range of motion.  Neurological: She is alert and oriented to person, place, and time.  Skin: Skin is warm. Capillary refill takes less than 2 seconds.  Lac to left hand  Psychiatric: She has a normal mood and affect. Her behavior is normal. Judgment and thought content normal.  Nursing note and vitals reviewed.    ED Treatments / Results  Labs (all labs ordered are listed, but only abnormal results are displayed) Labs Reviewed - No data to display  EKG  EKG Interpretation None       Radiology Dg Hand Complete Left  Result Date: 09/21/2017 CLINICAL DATA:  Dog bite to the left hand earlier this morning with lacerations between the index and long fingers and between the long and ring fingers. Initial encounter. EXAM: LEFT HAND - COMPLETE 3+ VIEW COMPARISON:  None. FINDINGS: Soft tissue swelling between the index and long finger and between the long and ring fingers at the site of the injury. No evidence of acute fracture or dislocation. Joint space narrowing involving the IP joints of the index, long, ring and small fingers. Remaining joint spaces well-preserved. Bone mineral density relatively well preserved. IMPRESSION: 1. No acute osseous abnormality. 2. 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 Electronically Signed    By: Evangeline Dakin M.D.   On: 09/21/2017 09:29    Procedures Procedures (including critical care time)  Medications Ordered in ED Medications  lidocaine (PF) (XYLOCAINE) 1 % injection 30 mL (not administered)  cephALEXin (KEFLEX) capsule 500 mg (500 mg Oral Given 09/21/17 0923)  lidocaine (XYLOCAINE) 1 % (with pres) injection (20 mLs  Given by Other 09/21/17 8546)     Initial Impression / Assessment and Plan / ED Course  I have reviewed the triage vital signs and the nursing notes.  Pertinent labs & imaging results that were available during my care of the patient were reviewed by me and considered in my medical decision  making (see chart for details).     Laceration repaired by PA Phylliss Bob.    Final Clinical Impressions(s) / ED Diagnoses   Final diagnoses:  Dog bite of left hand, initial encounter    ED Discharge Orders        Ordered    cephALEXin (KEFLEX) 500 MG capsule  4 times daily     09/21/17 0951       Isla Pence, MD 09/21/17 1032

## 2017-09-21 NOTE — ED Notes (Signed)
Patient transported to X-ray 

## 2017-09-21 NOTE — ED Notes (Signed)
ED Provider at bedside. 

## 2017-09-21 NOTE — ED Triage Notes (Signed)
Pt reports bit on her left hand by her dog this morning. Per pt dog is utd on all vaccines. No bleeding noted to wound at this time.

## 2018-06-15 ENCOUNTER — Other Ambulatory Visit: Payer: Self-pay | Admitting: Internal Medicine

## 2018-06-15 DIAGNOSIS — Z1231 Encounter for screening mammogram for malignant neoplasm of breast: Secondary | ICD-10-CM

## 2018-07-14 ENCOUNTER — Ambulatory Visit: Payer: 59

## 2018-07-15 ENCOUNTER — Ambulatory Visit
Admission: RE | Admit: 2018-07-15 | Discharge: 2018-07-15 | Disposition: A | Discharge status: Home / Self Care | Payer: 59 | Source: Ambulatory Visit | Attending: Internal Medicine | Admitting: Internal Medicine

## 2018-07-15 ENCOUNTER — Other Ambulatory Visit: Payer: Self-pay | Admitting: Internal Medicine

## 2018-07-15 DIAGNOSIS — Z1231 Encounter for screening mammogram for malignant neoplasm of breast: Secondary | ICD-10-CM

## 2018-07-15 HISTORY — DX: Personal history of antineoplastic chemotherapy: Z92.21

## 2018-07-15 HISTORY — DX: Personal history of irradiation: Z92.3

## 2018-10-31 HISTORY — PX: SALPINGOOPHORECTOMY: SHX82

## 2019-06-02 ENCOUNTER — Emergency Department (HOSPITAL_BASED_OUTPATIENT_CLINIC_OR_DEPARTMENT_OTHER): Payer: Medicare Other

## 2019-06-02 ENCOUNTER — Other Ambulatory Visit: Payer: Self-pay

## 2019-06-02 ENCOUNTER — Observation Stay (HOSPITAL_BASED_OUTPATIENT_CLINIC_OR_DEPARTMENT_OTHER)
Admission: EM | Admit: 2019-06-02 | Discharge: 2019-06-03 | Disposition: A | Discharge status: Home / Self Care | Payer: Medicare Other | Attending: Internal Medicine | Admitting: Internal Medicine

## 2019-06-02 ENCOUNTER — Encounter (HOSPITAL_BASED_OUTPATIENT_CLINIC_OR_DEPARTMENT_OTHER): Payer: Self-pay

## 2019-06-02 DIAGNOSIS — Z8249 Family history of ischemic heart disease and other diseases of the circulatory system: Secondary | ICD-10-CM | POA: Diagnosis not present

## 2019-06-02 DIAGNOSIS — M25512 Pain in left shoulder: Secondary | ICD-10-CM | POA: Insufficient documentation

## 2019-06-02 DIAGNOSIS — Z923 Personal history of irradiation: Secondary | ICD-10-CM | POA: Insufficient documentation

## 2019-06-02 DIAGNOSIS — Z9221 Personal history of antineoplastic chemotherapy: Secondary | ICD-10-CM | POA: Insufficient documentation

## 2019-06-02 DIAGNOSIS — Z0184 Encounter for antibody response examination: Secondary | ICD-10-CM | POA: Diagnosis not present

## 2019-06-02 DIAGNOSIS — R072 Precordial pain: Secondary | ICD-10-CM | POA: Diagnosis not present

## 2019-06-02 DIAGNOSIS — I200 Unstable angina: Secondary | ICD-10-CM | POA: Diagnosis present

## 2019-06-02 DIAGNOSIS — R001 Bradycardia, unspecified: Secondary | ICD-10-CM | POA: Insufficient documentation

## 2019-06-02 DIAGNOSIS — E119 Type 2 diabetes mellitus without complications: Secondary | ICD-10-CM | POA: Diagnosis not present

## 2019-06-02 DIAGNOSIS — Z803 Family history of malignant neoplasm of breast: Secondary | ICD-10-CM | POA: Insufficient documentation

## 2019-06-02 DIAGNOSIS — Z20828 Contact with and (suspected) exposure to other viral communicable diseases: Secondary | ICD-10-CM | POA: Diagnosis not present

## 2019-06-02 DIAGNOSIS — Z885 Allergy status to narcotic agent status: Secondary | ICD-10-CM | POA: Insufficient documentation

## 2019-06-02 DIAGNOSIS — Z86718 Personal history of other venous thrombosis and embolism: Secondary | ICD-10-CM | POA: Diagnosis not present

## 2019-06-02 DIAGNOSIS — I10 Essential (primary) hypertension: Secondary | ICD-10-CM | POA: Diagnosis not present

## 2019-06-02 DIAGNOSIS — Z86711 Personal history of pulmonary embolism: Secondary | ICD-10-CM | POA: Insufficient documentation

## 2019-06-02 DIAGNOSIS — E7800 Pure hypercholesterolemia, unspecified: Secondary | ICD-10-CM | POA: Insufficient documentation

## 2019-06-02 DIAGNOSIS — Z7984 Long term (current) use of oral hypoglycemic drugs: Secondary | ICD-10-CM | POA: Diagnosis not present

## 2019-06-02 DIAGNOSIS — Z881 Allergy status to other antibiotic agents status: Secondary | ICD-10-CM | POA: Diagnosis not present

## 2019-06-02 DIAGNOSIS — R079 Chest pain, unspecified: Secondary | ICD-10-CM | POA: Diagnosis not present

## 2019-06-02 DIAGNOSIS — Z853 Personal history of malignant neoplasm of breast: Secondary | ICD-10-CM | POA: Insufficient documentation

## 2019-06-02 DIAGNOSIS — E785 Hyperlipidemia, unspecified: Secondary | ICD-10-CM | POA: Diagnosis not present

## 2019-06-02 DIAGNOSIS — Z79899 Other long term (current) drug therapy: Secondary | ICD-10-CM | POA: Insufficient documentation

## 2019-06-02 LAB — CBC WITH DIFFERENTIAL/PLATELET
Abs Immature Granulocytes: 0.03 10*3/uL (ref 0.00–0.07)
Basophils Absolute: 0 10*3/uL (ref 0.0–0.1)
Basophils Relative: 1 %
Eosinophils Absolute: 0.4 10*3/uL (ref 0.0–0.5)
Eosinophils Relative: 5 %
HCT: 43 % (ref 36.0–46.0)
Hemoglobin: 14 g/dL (ref 12.0–15.0)
Immature Granulocytes: 1 %
Lymphocytes Relative: 34 %
Lymphs Abs: 2.3 10*3/uL (ref 0.7–4.0)
MCH: 32.6 pg (ref 26.0–34.0)
MCHC: 32.6 g/dL (ref 30.0–36.0)
MCV: 100 fL (ref 80.0–100.0)
Monocytes Absolute: 0.5 10*3/uL (ref 0.1–1.0)
Monocytes Relative: 8 %
Neutro Abs: 3.4 10*3/uL (ref 1.7–7.7)
Neutrophils Relative %: 51 %
Platelets: 189 10*3/uL (ref 150–400)
RBC: 4.3 MIL/uL (ref 3.87–5.11)
RDW: 12.8 % (ref 11.5–15.5)
WBC: 6.6 10*3/uL (ref 4.0–10.5)
nRBC: 0 % (ref 0.0–0.2)

## 2019-06-02 LAB — COMPREHENSIVE METABOLIC PANEL
ALT: 39 U/L (ref 0–44)
AST: 26 U/L (ref 15–41)
Albumin: 4.7 g/dL (ref 3.5–5.0)
Alkaline Phosphatase: 68 U/L (ref 38–126)
Anion gap: 11 (ref 5–15)
BUN: 16 mg/dL (ref 8–23)
CO2: 25 mmol/L (ref 22–32)
Calcium: 9.8 mg/dL (ref 8.9–10.3)
Chloride: 102 mmol/L (ref 98–111)
Creatinine, Ser: 0.92 mg/dL (ref 0.44–1.00)
GFR calc Af Amer: >60 mL/min (ref >60)
GFR calc non Af Amer: >60 mL/min (ref >60)
Glucose, Bld: 100 mg/dL — ABNORMAL HIGH (ref 70–99)
Potassium: 3.7 mmol/L (ref 3.5–5.1)
Sodium: 138 mmol/L (ref 135–145)
Total Bilirubin: 0.6 mg/dL (ref 0.3–1.2)
Total Protein: 7.4 g/dL (ref 6.5–8.1)

## 2019-06-02 LAB — GLUCOSE, CAPILLARY: Glucose-Capillary: 83 mg/dL (ref 70–99)

## 2019-06-02 LAB — TROPONIN I (HIGH SENSITIVITY)
Troponin I (High Sensitivity): 4 ng/L (ref <18)
Troponin I (High Sensitivity): 4 ng/L (ref <18)

## 2019-06-02 LAB — PROTIME-INR
INR: 1 (ref 0.8–1.2)
Prothrombin Time: 13.1 seconds (ref 11.4–15.2)

## 2019-06-02 LAB — D-DIMER, QUANTITATIVE (NOT AT ARMC): D-Dimer, Quant: <0.27 ug/mL-FEU (ref 0.00–0.50)

## 2019-06-02 LAB — SARS CORONAVIRUS 2 BY RT PCR (HOSPITAL ORDER, PERFORMED IN ~~LOC~~ HOSPITAL LAB): SARS Coronavirus 2: NEGATIVE

## 2019-06-02 MED ORDER — NITROGLYCERIN 2 % TD OINT
1.0000 [in_us] | TOPICAL_OINTMENT | Freq: Four times a day (QID) | TRANSDERMAL | Status: DC
  Administered 2019-06-03 (×2): 1 [in_us] via TOPICAL
  Filled 2019-06-02: qty 30

## 2019-06-02 MED ORDER — ONDANSETRON HCL 4 MG/2ML IJ SOLN: 4.0000 mg | Freq: Four times a day (QID) | INTRAMUSCULAR | Status: DC | PRN

## 2019-06-02 MED ORDER — ATORVASTATIN CALCIUM 10 MG PO TABS
20.0000 mg | ORAL_TABLET | Freq: Every day | ORAL | Status: DC
  Administered 2019-06-03: 20 mg via ORAL
  Filled 2019-06-02: qty 2

## 2019-06-02 MED ORDER — NITROGLYCERIN 0.4 MG SL SUBL
0.4000 mg | SUBLINGUAL_TABLET | SUBLINGUAL | Status: DC | PRN
  Administered 2019-06-02: 0.4 mg via SUBLINGUAL
  Filled 2019-06-02: qty 1

## 2019-06-02 MED ORDER — ACETAMINOPHEN 325 MG PO TABS
650.0000 mg | ORAL_TABLET | ORAL | Status: DC | PRN
  Administered 2019-06-03: 650 mg via ORAL
  Filled 2019-06-02: qty 2

## 2019-06-02 MED ORDER — ACETAMINOPHEN 325 MG PO TABS
650.0000 mg | ORAL_TABLET | Freq: Once | ORAL | Status: AC
  Administered 2019-06-02: 650 mg via ORAL
  Filled 2019-06-02: qty 2

## 2019-06-02 MED ORDER — MORPHINE SULFATE (PF) 2 MG/ML IV SOLN
INTRAVENOUS | Status: AC
  Administered 2019-06-02: 2 mg via INTRAVENOUS
  Filled 2019-06-02: qty 1

## 2019-06-02 MED ORDER — ASPIRIN 81 MG PO CHEW
324.0000 mg | CHEWABLE_TABLET | Freq: Once | ORAL | Status: AC
  Administered 2019-06-02: 324 mg via ORAL
  Filled 2019-06-02: qty 4

## 2019-06-02 MED ORDER — ENOXAPARIN SODIUM 40 MG/0.4ML ~~LOC~~ SOLN
40.0000 mg | SUBCUTANEOUS | Status: DC
  Administered 2019-06-03: 40 mg via SUBCUTANEOUS
  Filled 2019-06-02: qty 0.4

## 2019-06-02 MED ORDER — INSULIN ASPART 100 UNIT/ML ~~LOC~~ SOLN: 0.0000 [IU] | SUBCUTANEOUS | Status: DC

## 2019-06-02 MED ORDER — NITROGLYCERIN 2 % TD OINT
1.0000 [in_us] | TOPICAL_OINTMENT | Freq: Once | TRANSDERMAL | Status: AC
  Administered 2019-06-02: 1 [in_us] via TOPICAL
  Filled 2019-06-02: qty 1

## 2019-06-02 MED ORDER — MORPHINE SULFATE (PF) 2 MG/ML IV SOLN
2.0000 mg | INTRAVENOUS | Status: DC | PRN
  Administered 2019-06-02: 2 mg via INTRAVENOUS

## 2019-06-02 NOTE — ED Notes (Signed)
Pt reports increasing in chest pain now to 7/10

## 2019-06-02 NOTE — Care Management (Signed)
This is a no charge note  Transfer from [facility] per Dr. 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  Chest pain has subsided after treated with nitroglycerin.  Troponin is 4. EKG showed low voltage, occasional PAC, otherwise no significant change.  Patient was found to have WBC 6.1, negative d-dimer, pending COVID-19 test, INR 1.0, troponin 4, electrolytes renal function okay, temperature normal, tachycardia, oxygen saturation 97% on room air, blood pressure 144/62, negative chest x-ray.  Patient is placed on telemetry bed for observation.  Please call manager of [facility] at [phone] when pt arrives to floor   Ivor Costa, MD  [facility]   If 7PM-7AM, please contact night-coverage [email or web address] Password [facility] 06/02/2019, 9:28 PM

## 2019-06-02 NOTE — H&P (Signed)
History and Physical    Sherry Schultz [id number] DOB: 1956/04/15 DOA: 06/02/2019  PCP: Loraine Leriche., MD  Patient coming from: Home  I have personally briefly reviewed patient's old medical records in [place]  Chief Complaint: CP  HPI: Sherry Schultz is a 63 y.o. female with medical history significant of DM2, HTN, HLD.  Patient also has h/o DVT/PE but off of anticoagulation for 5 years, breast CA s/p mastectomy, chemo, radiation.  H/o LHC in 2014: No significant CAD and nl LV function.  Patient presents to the ED at [facility] with c/o CP.  Located in central chest.  Onset 2H PTA.  Constant and worsening.  Relieved initially with NTG.   ED Course: Trop neg x2.  EKG just shows supraventricular bigemeny in ED (now just Sinus slightly brady in the upper 50s here at [facility]).  CXR and D.Dimer also negative.   Review of Systems: As per HPI, otherwise all review of systems negative.  Past Medical History:  Diagnosis Date  . Breast cancer ([place])   . Diabetes mellitus without complication ([place])   . DVT (deep venous thrombosis) ([place])   . High cholesterol   . Hypertension   . Kidney stone   . Personal history of chemotherapy   . Personal history of radiation therapy   . Pulmonary emboli [facility])     Past Surgical History:  Procedure Laterality Date  . BREAST SURGERY  2005   left mastectomy  . LEFT HEART CATHETERIZATION WITH CORONARY ANGIOGRAM N/A 08/24/2013   Procedure: LEFT HEART CATHETERIZATION WITH CORONARY ANGIOGRAM;  Surgeon: Peter M Martinique, MD;  Location: [facility] CATH LAB;  Service: Cardiovascular;  Laterality: N/A;  . MASTECTOMY Bilateral     malignant, right benign  . SALPINGOOPHORECTOMY  10/2018     reports that she has never smoked. She has never used smokeless tobacco. She reports that she does not drink alcohol or use drugs.  Allergies  Allergen Reactions  . Codeine Rash    Family History  Problem Relation Age of Onset  . Breast cancer Mother   .  Heart attack Father      Prior to Admission medications   Medication Sig Start Date End Date Taking? Authorizing Provider  atorvastatin (LIPITOR) 20 MG tablet Take 20 mg by mouth daily.    [provider]  Calcium Carbonate-Vit D-Min (CALCIUM 1200 PO) Take 1,200 mg by mouth 2 (two) times daily.    [provider]  dicyclomine (BENTYL) 20 MG tablet Take 1 tablet (20 mg total) by mouth 2 (two) times daily. 03/01/17   Margarita Mail, PA-C  losartan (COZAAR) 50 MG tablet Take 0.5 tablets (25 mg total) by mouth daily. 08/24/13   Dunn, Nedra Hai, PA-C  metFORMIN (GLUCOPHAGE) 500 MG tablet Take 1 tablet (500 mg total) by mouth 2 (two) times daily with a meal. 08/26/13   Dunn, Nedra Hai, PA-C  Multiple Vitamin (MULTIVITAMIN WITH MINERALS) TABS tablet Take 1 tablet by mouth daily.    [provider]  omeprazole (PRILOSEC) 20 MG capsule Take 1 capsule (20 mg total) by mouth daily. 08/24/13   Dunn, Nedra Hai, PA-C  PARoxetine (PAXIL) 20 MG tablet Take 20 mg by mouth daily.    [provider]  potassium chloride SA (K-DUR,KLOR-CON) 20 MEQ tablet Take 2 tablets (40 mEq total) by mouth once. 03/01/17 03/01/17  Margarita Mail, PA-C    Physical Exam: Vitals:   06/02/19 2200 06/02/19 2230 06/02/19 2320 06/02/19 2325  BP: (!) 113/56  103/66  116/64  Pulse: (!) 49 (!) 53  (!) 47  Resp: (!) 21 15  18   Temp:    97.8 F (36.6 C)  TempSrc:    Oral  SpO2: 96% 96%  100%  Weight:   78.1 kg   Height:   5\' 5"  (1.651 m)     Constitutional: NAD, calm, comfortable Eyes: PERRL, lids and conjunctivae normal ENMT: Mucous membranes are moist. Posterior pharynx clear of any exudate or lesions.Normal dentition.  Neck: normal, supple, no masses, no thyromegaly Respiratory: clear to auscultation bilaterally, no wheezing, no crackles. Normal respiratory effort. No accessory muscle use.  Cardiovascular: Regular rate and rhythm, no murmurs / rubs / gallops. No extremity edema. 2+ pedal pulses. No  carotid bruits.  Abdomen: no tenderness, no masses palpated. No hepatosplenomegaly. Bowel sounds positive.  Musculoskeletal: no clubbing / cyanosis. No joint deformity upper and lower extremities. Good ROM, no contractures. Normal muscle tone.  Skin: no rashes, lesions, ulcers. No induration Neurologic: CN 2-12 grossly intact. Sensation intact, DTR normal. Strength 5/5 in all 4.  Psychiatric: Normal judgment and insight. Alert and oriented x 3. Normal mood.    Labs on Admission: I have personally reviewed following labs and imaging studies  CBC: Recent Labs  Lab 06/02/19 1902  WBC 6.6  NEUTROABS 3.4  HGB 14.0  HCT 43.0  MCV 100.0  PLT 99991111   Basic Metabolic Panel: Recent Labs  Lab 06/02/19 1902  NA 138  K 3.7  CL 102  CO2 25  GLUCOSE 100*  BUN 16  CREATININE 0.92  CALCIUM 9.8   GFR: Estimated Creatinine Clearance: 62.9 mL/min (by C-G formula based on SCr of 0.92 mg/dL). Liver Function Tests: Recent Labs  Lab 06/02/19 1902  AST 26  ALT 39  ALKPHOS 68  BILITOT 0.6  PROT 7.4  ALBUMIN 4.7   No results for input(s): LIPASE, AMYLASE in the last 168 hours. No results for input(s): AMMONIA in the last 168 hours. Coagulation Profile: Recent Labs  Lab 06/02/19 1902  INR 1.0   Cardiac Enzymes: No results for input(s): CKTOTAL, CKMB, CKMBINDEX, TROPONINI in the last 168 hours. BNP (last 3 results) No results for input(s): PROBNP in the last 8760 hours. HbA1C: No results for input(s): HGBA1C in the last 72 hours. CBG: Recent Labs  Lab 06/02/19 2329  GLUCAP 83   Lipid Profile: No results for input(s): CHOL, HDL, LDLCALC, TRIG, CHOLHDL, LDLDIRECT in the last 72 hours. Thyroid Function Tests: No results for input(s): TSH, T4TOTAL, FREET4, T3FREE, THYROIDAB in the last 72 hours. Anemia Panel: No results for input(s): VITAMINB12, FOLATE, FERRITIN, TIBC, IRON, RETICCTPCT in the last 72 hours. Urine analysis: No results found for: COLORURINE, APPEARANCEUR,  [place], [place], GLUCOSEU, [place], [place], [place], [place], [place], NITRITE, LEUKOCYTESUR  Radiological Exams on Admission: Dg Chest Port 1 View  Result Date: 06/02/2019 CLINICAL DATA:  Chest pain EXAM: PORTABLE CHEST 1 VIEW COMPARISON:  CT August 22, 2013, radiograph 02/20/2007 FINDINGS: The heart size and mediastinal contours are within normal limits. Both lungs are clear. The visualized skeletal structures are unremarkable. Clips over the left chest and breast. IMPRESSION: No active disease. Electronically Signed   By: Donavan Foil M.D.   On: 06/02/2019 19:24    EKG: Independently reviewed.  Assessment/Plan Principal Problem:   Chest pain Active Problems:   Essential hypertension   T2DM (type 2 diabetes mellitus) (HCC)   Dyslipidemia    1. CP r/o - 1. CXR and D.Dimer negative 1. PE and dissection seem  unlikely 2. CP obs pathway 3. Serial trops 4. Tele monitor 5. NPO after MN 6. NTG paste 7. Morphine PRN 8. ASA 325 in ED, will put in for 81 daily for now. 9. Cards eval in AM 2. HTN - 1. Pt on losartan but unsure of dose.  Med rec pending in AM. 3. DM2 - 1. Hold metformin 2. Sensitive SSI Q4H 4. HLD - Continue statin  DVT prophylaxis: Lovenox Code Status: Full Family Communication: No family in room Disposition Plan: Home after admit Consults called: message sent to [facility]. [place] for routine cards eval in AM Admission status: Place in [place]    Ronon Ferger, [place] [facility]  How to contact the [facility] Attending or Consulting provider [place] or covering provider during after hours [place], for this patient?  1. Check the care team in [facility] and look for a) attending/consulting [facility] provider listed and b) the [facility] team listed 2. Log into [email or web address]  Amion Physician Scheduling and messaging for groups and whole hospitals  On call and physician scheduling software for group practices, residents, hospitalists and other medical providers for call, clinic,  rotation and shift schedules. OnCall Enterprise is a hospital-wide system for scheduling doctors and paging doctors on call. EasyPlot is for scientific plotting and data analysis.  [email or web address]  and use [place]'s universal password to access. If you do not have the password, please contact the hospital operator.  3. Locate the [facility] provider you are looking for under [facility] and page to a number that you can be directly reached. 4. If you still have difficulty reaching the provider, please page the [facility] (Director on Call) for the Hospitalists listed on amion for assistance.  06/02/2019, 11:49 PM

## 2019-06-02 NOTE — ED Triage Notes (Signed)
Pt presents with central chest pain that radiates to the left and into her arm x a "few hours". Pt denies SOB.

## 2019-06-02 NOTE — ED Provider Notes (Signed)
[facility]ENCY [facility] Provider Note   CSN: [id number] Arrival date & time: 06/02/19  [place]     History   Chief Complaint Chief Complaint  Patient presents with  . Chest Pain    HPI Sherry Schultz is a 63 y.o. female.     The history is provided by the patient.  Chest Pain Pain location:  Substernal area and L chest Pain quality: aching, shooting and throbbing   Pain radiates to:  L shoulder, R shoulder, mid back, upper back and L arm Pain severity:  Severe Onset quality:  Sudden Duration:  2 hours Timing:  Constant Progression:  Worsening Chronicity:  New Context comment:  Started while at rest Relieved by:  Nothing Worsened by:  Exertion and movement Ineffective treatments:  None tried Associated symptoms: back pain   Associated symptoms: no abdominal pain, no altered mental status, no anorexia, no cough, no diaphoresis, no fever, no headache, no lower extremity edema, no nausea, no numbness, no palpitations, no shortness of breath, no syncope, no vomiting and no weakness   Risk factors: diabetes mellitus, high cholesterol, hypertension and prior DVT/PE   Risk factors: no coronary artery disease and no smoking   Risk factors comment:  Prior history of breast cancer status post chemotherapy 15 years ago.  Had DVT while getting chemotherapy but has been off anticoagulation for 5 years.  Father with a 5 vessel bypass in his 50s but no other family members with heart disease.   Past Medical History:  Diagnosis Date  . Breast cancer ([place])   . Diabetes mellitus without complication ([place])   . DVT (deep venous thrombosis) ([place])   . High cholesterol   . Hypertension   . Kidney stone   . Personal history of chemotherapy   . Personal history of radiation therapy   . Pulmonary emboli [facility])     Patient Active Problem List   Diagnosis Date Noted  . Chest pain 08/22/2013  . Essential hypertension 08/22/2013  . T2DM (type 2 diabetes mellitus) ([place])  08/22/2013  . Dyslipidemia 08/22/2013    Past Surgical History:  Procedure Laterality Date  . BREAST SURGERY  2005   left mastectomy  . LEFT HEART CATHETERIZATION WITH CORONARY ANGIOGRAM N/A 08/24/2013   Procedure: LEFT HEART CATHETERIZATION WITH CORONARY ANGIOGRAM;  Surgeon: Peter M Martinique, MD;  Location: [facility] CATH LAB;  Service: Cardiovascular;  Laterality: N/A;  . MASTECTOMY Bilateral     malignant, right benign     OB History   No obstetric history on file.      Home Medications    Prior to Admission medications   Medication Sig Start Date End Date Taking? Authorizing Provider  amoxicillin-clavulanate (AUGMENTIN) 875-125 MG tablet Take 1 tablet by mouth 2 (two) times daily. One po bid x 7 days 11/13/16   Everlene Balls, MD  atorvastatin (LIPITOR) 20 MG tablet Take 20 mg by mouth daily.    [provider]  Calcium Carbonate-Vit D-Min (CALCIUM 1200 PO) Take 1,200 mg by mouth 2 (two) times daily.    [provider]  cephALEXin (KEFLEX) 500 MG capsule Take 1 capsule (500 mg total) by mouth 4 (four) times daily. 09/21/17   Isla Pence, MD  ciprofloxacin (CIPRO) 500 MG tablet Take 1 tablet (500 mg total) by mouth every 12 (twelve) hours. 03/01/17   Margarita Mail, PA-C  dicyclomine (BENTYL) 20 MG tablet Take 1 tablet (20 mg total) by mouth 2 (two) times daily. 03/01/17   Margarita Mail, PA-C  ibandronate (BONIVA) 150 MG tablet Take 150 mg by mouth every 30 (thirty) days.  06/06/13   [provider]  losartan (COZAAR) 50 MG tablet Take 0.5 tablets (25 mg total) by mouth daily. 08/24/13   Dunn, Nedra Hai, PA-C  metFORMIN (GLUCOPHAGE) 500 MG tablet Take 1 tablet (500 mg total) by mouth 2 (two) times daily with a meal. 08/26/13   Dunn, Dayna N, PA-C  metroNIDAZOLE (FLAGYL) 500 MG tablet Take 1 tablet (500 mg total) by mouth 2 (two) times daily. 03/01/17   Margarita Mail, PA-C  Multiple Vitamin (MULTIVITAMIN WITH MINERALS) TABS tablet Take 1 tablet by mouth daily.     [provider]  omeprazole (PRILOSEC) 20 MG capsule Take 1 capsule (20 mg total) by mouth daily. 08/24/13   Dunn, Nedra Hai, PA-C  PARoxetine (PAXIL) 20 MG tablet Take 20 mg by mouth daily.    [provider]  potassium chloride SA (K-DUR,KLOR-CON) 20 MEQ tablet Take 2 tablets (40 mEq total) by mouth once. 03/01/17 03/01/17  Margarita Mail, PA-C  warfarin (COUMADIN) 7.5 MG tablet Take 7.5 mg by mouth every evening.    [provider]    Family History Family History  Problem Relation Age of Onset  . Breast cancer Mother   . Heart attack Father     Social History Social History   Tobacco Use  . Smoking status: Never Smoker  . Smokeless tobacco: Never Used  Substance Use Topics  . Alcohol use: No  . Drug use: No     Allergies   Codeine   Review of Systems Review of Systems  Constitutional: Negative for diaphoresis and fever.  Respiratory: Negative for cough and shortness of breath.   Cardiovascular: Positive for chest pain. Negative for palpitations and syncope.  Gastrointestinal: Negative for abdominal pain, anorexia, nausea and vomiting.  Musculoskeletal: Positive for back pain.  Neurological: Negative for weakness, numbness and headaches.  All other systems reviewed and are negative.    Physical Exam Updated Vital Signs BP (!) 150/65 (BP Location: Right Arm)   Pulse 63   Temp 98.1 F (36.7 C) (Oral)   Resp 17   Ht 5\' 5"  (1.651 m)   Wt 77.1 kg   SpO2 99%   BMI 28.29 kg/m   Physical Exam Vitals signs and nursing note reviewed.  Constitutional:      General: She is not in acute distress.    Appearance: She is well-developed and normal weight.     Comments: Appears uncomfortable  HENT:     Head: Normocephalic and atraumatic.  Eyes:     Conjunctiva/sclera: Conjunctivae normal.     Pupils: Pupils are equal, round, and reactive to light.  Neck:     Musculoskeletal: Normal range of motion and neck supple.  Cardiovascular:     Rate  and Rhythm: Normal rate and regular rhythm.     Pulses: Normal pulses.     Heart sounds: No murmur.  Pulmonary:     Effort: Pulmonary effort is normal. No respiratory distress.     Breath sounds: Normal breath sounds. No wheezing or rales.  Abdominal:     General: There is no distension.     Palpations: Abdomen is soft.     Tenderness: There is no abdominal tenderness. There is no guarding or rebound.  Musculoskeletal: Normal range of motion.        General: No tenderness.     Right lower leg: No edema.     Left lower leg: No  edema.  Skin:    General: Skin is warm and dry.     Findings: No erythema or rash.  Neurological:     General: No focal deficit present.     Mental Status: She is alert and oriented to person, place, and time. Mental status is at baseline.  Psychiatric:        Mood and Affect: Mood normal.        Behavior: Behavior normal.        Thought Content: Thought content normal.      ED Treatments / Results  Labs (all labs ordered are listed, but only abnormal results are displayed) Labs Reviewed  COMPREHENSIVE METABOLIC PANEL - Abnormal; Notable for the following components:      Result Value   Glucose, Bld 100 (*)    All other components within normal limits  SARS CORONAVIRUS 2 (HOSPITAL ORDER, [place] LAB)  CBC WITH DIFFERENTIAL/PLATELET  PROTIME-INR  D-DIMER, QUANTITATIVE (NOT AT [facility])  TROPONIN I (HIGH SENSITIVITY)    EKG EKG Interpretation  Date/Time:  Wednesday June 02 2019 18:54:59 EDT Ventricular Rate:  62 PR Interval:    QRS Duration: 97 QT Interval:  419 QTC Calculation: 390 R Axis:   -8 Text Interpretation:  Sinus rhythm Supraventricular bigeminy RSR' in V1 or V2, probably normal variant Confirmed by Blanchie Dessert [phone]) on 06/02/2019 7:02:56 PM   Radiology Dg Chest Port 1 View  Result Date: 06/02/2019 CLINICAL DATA:  Chest pain EXAM: PORTABLE CHEST 1 VIEW COMPARISON:  CT August 22, 2013, radiograph  02/20/2007 FINDINGS: The heart size and mediastinal contours are within normal limits. Both lungs are clear. The visualized skeletal structures are unremarkable. Clips over the left chest and breast. IMPRESSION: No active disease. Electronically Signed   By: Donavan Foil M.D.   On: 06/02/2019 19:24    Procedures Procedures (including critical care time)  Medications Ordered in ED Medications  nitroGLYCERIN (NITROSTAT) SL tablet 0.4 mg (has no administration in time range)  aspirin chewable tablet 324 mg (has no administration in time range)     Initial Impression / Assessment and Plan / ED Course  I have reviewed the triage vital signs and the nursing notes.  Pertinent labs & imaging results that were available during my care of the patient were reviewed by me and considered in my medical decision making (see chart for details).        63 year old female presenting today with sudden onset of chest pain 2 hours ago with radiation into her left arm down to her hand as well as into her left scapular region.  While taking exam here she states it was moving to her right arm.  She denies any pain into her abdomen or her legs.  She has no shortness of breath or nausea associated with this but states she had diarrhea after the pain started.  She has not recently been ill with any infectious type symptoms such as cough, fever, congestion.  She has never had any symptoms like this before and has no known heart problems.  Patient does have a prior history of PE and DVT while she was undergoing chemotherapy but had not had any recurrence and is been off of anticoagulation for approximately 5 years.  Hear score of 6 for risk factor and nature of the pain.  Concern for ACS versus dissection versus PE.  Low suspicion for pneumonia, pneumothorax or acute abdominal pathology.  Labs and chest x-ray pending.  Patient given nitroglycerin  and aspirin.  8:15 PM Patient's labs are all initially within normal  limits.  Chest x-ray without acute findings.  After nitroglycerin patient's pain has completely resolved.  She states after approximately an hour of the medication she started to have some mild return of pain in her left arm.  Patient placed on Nitropaste.  We will plan on admitting for chest pain rule out.  Final Clinical Impressions(s) / ED Diagnoses   Final diagnoses:  Unstable angina [facility])    ED Discharge Orders    None       Blanchie Dessert, MD 06/02/19 2055

## 2019-06-03 DIAGNOSIS — E119 Type 2 diabetes mellitus without complications: Secondary | ICD-10-CM | POA: Diagnosis not present

## 2019-06-03 DIAGNOSIS — I10 Essential (primary) hypertension: Secondary | ICD-10-CM

## 2019-06-03 DIAGNOSIS — R079 Chest pain, unspecified: Secondary | ICD-10-CM | POA: Diagnosis not present

## 2019-06-03 LAB — LIPID PANEL
Cholesterol: 128 mg/dL (ref 0–200)
HDL: 37 mg/dL — ABNORMAL LOW (ref >40)
LDL Cholesterol: 69 mg/dL (ref 0–99)
Total CHOL/HDL Ratio: 3.5 RATIO
Triglycerides: 108 mg/dL (ref <150)
VLDL: 22 mg/dL (ref 0–40)

## 2019-06-03 LAB — TROPONIN I (HIGH SENSITIVITY)
Troponin I (High Sensitivity): 4 ng/L (ref <18)
Troponin I (High Sensitivity): 4 ng/L (ref <18)

## 2019-06-03 LAB — GLUCOSE, CAPILLARY
Glucose-Capillary: 118 mg/dL — ABNORMAL HIGH (ref 70–99)
Glucose-Capillary: 117 mg/dL — ABNORMAL HIGH (ref 70–99)
Glucose-Capillary: 110 mg/dL — ABNORMAL HIGH (ref 70–99)

## 2019-06-03 LAB — HEMOGLOBIN A1C
Hgb A1c MFr Bld: 6.8 % — ABNORMAL HIGH (ref 4.8–5.6)
Mean Plasma Glucose: 148.46 mg/dL

## 2019-06-03 LAB — HIV ANTIBODY (ROUTINE TESTING W REFLEX): HIV Screen 4th Generation wRfx: NONREACTIVE

## 2019-06-03 MED ORDER — ASPIRIN EC 81 MG PO TBEC
81.0000 mg | DELAYED_RELEASE_TABLET | Freq: Every day | ORAL | Status: DC
  Administered 2019-06-03: 08:00:00 81 mg via ORAL
  Filled 2019-06-03: qty 1

## 2019-06-03 NOTE — Discharge Summary (Signed)
Physician Discharge Summary  Sherry Schultz [id number] DOB: Nov 24, 1955 DOA: 06/02/2019  PCP: Loraine Leriche., MD  Admit date: 06/02/2019 Discharge date: 06/03/2019  Time spent: 35 minutes  Recommendations for Outpatient Follow-up:  1. PCP in 1 week 2. Cardiology Dr. Martinique as needed   Discharge Diagnoses:  Principal Problem:   Chest pain Active Problems:   Essential hypertension   T2DM (type 2 diabetes mellitus) ([place])   Dyslipidemia   Discharge Condition: Stable  Diet recommendation: Diabetic, heart healthy  Filed Weights   06/02/19 1852 06/02/19 2320  Weight: 77.1 kg 78.1 kg    History of present illness:  63 year old female history of hypertension diabetes and dyslipidemia presented to the ED with acute onset left shoulder pain radiating down her left arm followed by substernal chest pain/pressure which lasted for 4 hours  Hospital Course:   Atypical chest pain -Ruled out for ACS, had a negative cardiac cath in 2014, normal stress test in 2017 -EKG was normal, troponin high-sensitivity x3 was negative which was very reassuring -Seen by cardiology in consultation who felt her symptoms were very atypical and not consistent with ACS -D-dimer was also negative -Discharged home in a stable condition, advised to follow-up with Dr. Martinique as needed, if symptoms recur a coronary CTA could be considered per cards  Rest of her medical problems were stable   Consultations: Cardiology Discharge Exam: Vitals:   06/03/19 1100 06/03/19 1200  BP: 113/63 108/63  Pulse: (!) 54 (!) 40  Resp: 13 17  Temp:    SpO2: 95% 96%    General: AAOx3 Cardiovascular: S1S2/RRR Respiratory: CTAB  Discharge Instructions   Discharge Instructions    Diet - low sodium heart healthy   Complete by: As directed    Diet Carb Modified   Complete by: As directed    Increase activity slowly   Complete by: As directed      Allergies as of 06/03/2019      Reactions   Hydromorphone  Other (See Comments)   confusion   Codeine Rash   Nitrofurantoin Nausea Only      Medication List    STOP taking these medications   dicyclomine 20 MG tablet Commonly known as: BENTYL   potassium chloride SA 20 MEQ tablet Commonly known as: K-DUR     TAKE these medications   acetaminophen 500 MG tablet Commonly known as: TYLENOL Take 1,000 mg by mouth every 6 (six) hours as needed for mild pain or headache.   aspirin EC 81 MG tablet Take 81 mg by mouth daily.   atorvastatin 20 MG tablet Commonly known as: LIPITOR Take 20 mg by mouth daily.   CALCIUM 1200 PO Take 1,200 mg by mouth 2 (two) times daily.   losartan 50 MG tablet Commonly known as: COZAAR Take 0.5 tablets (25 mg total) by mouth daily. What changed: how much to take   metFORMIN 500 MG tablet Commonly known as: GLUCOPHAGE Take 1 tablet (500 mg total) by mouth 2 (two) times daily with a meal.   multivitamin with minerals Tabs tablet Take 1 tablet by mouth daily.   omeprazole 20 MG capsule Commonly known as: PriLOSEC Take 1 capsule (20 mg total) by mouth daily. What changed: when to take this   PARoxetine 20 MG tablet Commonly known as: PAXIL Take 20 mg by mouth 2 (two) times daily.      Allergies  Allergen Reactions  . Hydromorphone Other (See Comments)    confusion  . Codeine Rash  .  Nitrofurantoin Nausea Only   Follow-up Information    Martinique, Peter M, MD Follow up.   Specialty: Cardiology Why: Follow up as needed.  Contact information: [address] [place] [address] [phone]            The results of significant diagnostics from this hospitalization (including imaging, microbiology, ancillary and laboratory) are listed below for reference.    Significant Diagnostic Studies: Dg Chest Port 1 View  Result Date: 06/02/2019 CLINICAL DATA:  Chest pain EXAM: PORTABLE CHEST 1 VIEW COMPARISON:  CT August 22, 2013, radiograph 02/20/2007 FINDINGS: The heart size  and mediastinal contours are within normal limits. Both lungs are clear. The visualized skeletal structures are unremarkable. Clips over the left chest and breast. IMPRESSION: No active disease. Electronically Signed   By: Donavan Foil M.D.   On: 06/02/2019 19:24    Microbiology: Recent Results (from the past 240 hour(s))  SARS Coronavirus 2 [facility] order, Performed in [facility] hospital lab) Nasopharyngeal Nasopharyngeal Swab     Status: None   Collection Time: 06/02/19  8:19 PM   Specimen: Nasopharyngeal Swab  Result Value Ref Range Status   SARS Coronavirus 2 NEGATIVE NEGATIVE Final    Comment: (NOTE) If result is NEGATIVE SARS-CoV-2 target nucleic acids are NOT DETECTED. The SARS-CoV-2 RNA is generally detectable in upper and lower  respiratory specimens during the acute phase of infection. The lowest  concentration of SARS-CoV-2 viral copies this assay can detect is 250  copies / mL. A negative result does not preclude SARS-CoV-2 infection  and should not be used as the sole basis for treatment or other  patient management decisions.  A negative result may occur with  improper specimen collection / handling, submission of specimen other  than nasopharyngeal swab, presence of viral mutation(s) within the  areas targeted by this assay, and inadequate number of viral copies  (<250 copies / mL). A negative result must be combined with clinical  observations, patient history, and epidemiological information. If result is POSITIVE SARS-CoV-2 target nucleic acids are DETECTED. The SARS-CoV-2 RNA is generally detectable in upper and lower  respiratory specimens dur ing the acute phase of infection.  Positive  results are indicative of active infection with SARS-CoV-2.  Clinical  correlation with patient history and other diagnostic information is  necessary to determine patient infection status.  Positive results do  not rule out bacterial infection or co-infection with other  viruses. If result is PRESUMPTIVE POSTIVE SARS-CoV-2 nucleic acids MAY BE PRESENT.   A presumptive positive result was obtained on the submitted specimen  and confirmed on repeat testing.  While 2019 novel coronavirus  (SARS-CoV-2) nucleic acids may be present in the submitted sample  additional confirmatory testing may be necessary for epidemiological  and / or clinical management purposes  to differentiate between  SARS-CoV-2 and other Sarbecovirus currently known to infect humans.  If clinically indicated additional testing with an alternate test  methodology [phone]) is advised. The SARS-CoV-2 RNA is generally  detectable in upper and lower respiratory sp ecimens during the acute  phase of infection. The expected result is Negative. Fact Sheet for Patients:  StrictlyIdeas.no Fact Sheet for Healthcare Providers: BankingDealers.co.za This test is not yet approved or cleared by the [place] FDA and has been authorized for detection and/or diagnosis of SARS-CoV-2 by FDA under an Emergency Use Authorization (EUA).  This EUA will remain in effect (meaning this test can be used) for the duration of the COVID-19 declaration under  Section 564(b)(1) of the Act, 21 U.S.C. section 360bbb-3(b)(1), unless the authorization is terminated or revoked sooner. Performed at [facility], [address] [place], [place] [address]      Labs: Basic Metabolic Panel: Recent Labs  Lab 06/02/19 1902  NA 138  K 3.7  CL 102  CO2 25  GLUCOSE 100*  BUN 16  CREATININE 0.92  CALCIUM 9.8   Liver Function Tests: Recent Labs  Lab 06/02/19 1902  AST 26  ALT 39  ALKPHOS 68  BILITOT 0.6  PROT 7.4  ALBUMIN 4.7   No results for input(s): LIPASE, AMYLASE in the last 168 hours. No results for input(s): AMMONIA in the last 168 hours. CBC: Recent Labs  Lab 06/02/19 1902  WBC 6.6  NEUTROABS 3.4  HGB 14.0  HCT 43.0  MCV 100.0  PLT  189   Cardiac Enzymes: No results for input(s): CKTOTAL, CKMB, CKMBINDEX, TROPONINI in the last 168 hours. BNP: BNP (last 3 results) No results for input(s): BNP in the last 8760 hours.  ProBNP (last 3 results) No results for input(s): PROBNP in the last 8760 hours.  CBG: Recent Labs  Lab 06/02/19 2329 06/03/19 0259 06/03/19 0748 06/03/19 1119  GLUCAP 83 118* 117* 110*       Signed:  Domenic Polite MD.  [facility] 06/03/2019, 3:24 PM

## 2019-06-03 NOTE — Consult Note (Signed)
Cardiology Consultation:   Patient ID: [name]; [id number]; 04/09/1956   Admit date: 06/02/2019 Date of Consult: 06/03/2019  Primary Care Provider: Loraine Leriche., MD Primary Cardiologist: Seen by Dr. Martinique during admission in 2014 Primary Electrophysiologist:  None    Patient Profile:   Sherry Schultz is a 63 y.o. female with a PMH of HTN, HLD, DM type 2, remote DVT/PE not on [facility], breast cancer s/p mastectomy/chemo/radiation, who is being seen today for the evaluation of chest pain at the request of Dr. Alcario Drought.  History of Present Illness:   Ms. Scow presented with sudden onset left shoulder pain that radiated to her chest and down her arm that started around 5pm 06/02/2019. She reported she had just stood up to go feed her dogs when the pain started. She had an episode of diarrhea about 30 minutes after onset of pain. No complaints of associated SOB, diaphoresis, dizziness, lightheadedness, or syncope. Given persistence of pain she presented to the ED for further evaluation.  She was last evaluated by cardiology while admitted to the hospital in 2014 after presenting with chest pain relieved with nitroglycerin. Given concerns for unstable angina she underwent a LHC at that time which showed no significant CAD and normal LV function. It was felt her pain might be related to GERD and she was recommended for a PPI. No further ischemic evaluation since that time. Her last echocardiogram showed EF 60-65% and no significant valvular abnormalities.   At the time of this evaluation she is chest pain free. She reports pain subsided with SL nitro in the ED but came back within 1 hour, after which she was given nitro paste with resolution of symptoms. Prior to this she had no complaints of chest pain at rest or with exertion. She reports chronic DOE since her PE. No complaints of orthopnea, PND, or LE edema.  Hospital course: BP overall on the soft side, bradycardic to the 40s-50s,  otherwise VSS. Labs notable for electrolytes wnl, Cr 0.92, CBC wnl, HsT 4 x3, Ddimer wnl, Hgb A1C 6.8, COVID 19 negative. CXR without acute findings. EKG with sinus rhythm with sinus arrhythmia and PACs, no STE/D, no TWI. She was given aspirin, SL nitro, and nitro paste in the ED with resolution of symptoms. Cardiology asked to evaluate.   Past Medical History:  Diagnosis Date  . Breast cancer ([place])   . Diabetes mellitus without complication ([place])   . DVT (deep venous thrombosis) ([place])   . High cholesterol   . Hypertension   . Kidney stone   . Personal history of chemotherapy   . Personal history of radiation therapy   . Pulmonary emboli [facility])     Past Surgical History:  Procedure Laterality Date  . BREAST SURGERY  2005   left mastectomy  . LEFT HEART CATHETERIZATION WITH CORONARY ANGIOGRAM N/A 08/24/2013   Procedure: LEFT HEART CATHETERIZATION WITH CORONARY ANGIOGRAM;  Surgeon: Peter M Martinique, MD;  Location: [facility] CATH LAB;  Service: Cardiovascular;  Laterality: N/A;  . MASTECTOMY Bilateral     malignant, right benign  . SALPINGOOPHORECTOMY  10/2018     Home Medications:  Prior to Admission medications   Medication Sig Start Date End Date Taking? Authorizing Provider  atorvastatin (LIPITOR) 20 MG tablet Take 20 mg by mouth daily.    [provider]  Calcium Carbonate-Vit D-Min (CALCIUM 1200 PO) Take 1,200 mg by mouth 2 (two) times daily.    [provider]  dicyclomine (BENTYL) 20 MG tablet  Take 1 tablet (20 mg total) by mouth 2 (two) times daily. 03/01/17   Margarita Mail, PA-C  losartan (COZAAR) 50 MG tablet Take 0.5 tablets (25 mg total) by mouth daily. 08/24/13   Dunn, Nedra Hai, PA-C  metFORMIN (GLUCOPHAGE) 500 MG tablet Take 1 tablet (500 mg total) by mouth 2 (two) times daily with a meal. 08/26/13   Dunn, Nedra Hai, PA-C  Multiple Vitamin (MULTIVITAMIN WITH MINERALS) TABS tablet Take 1 tablet by mouth daily.    [provider]  omeprazole (PRILOSEC) 20 MG  capsule Take 1 capsule (20 mg total) by mouth daily. 08/24/13   Dunn, Nedra Hai, PA-C  PARoxetine (PAXIL) 20 MG tablet Take 20 mg by mouth daily.    [provider]  potassium chloride SA (K-DUR,KLOR-CON) 20 MEQ tablet Take 2 tablets (40 mEq total) by mouth once. 03/01/17 03/01/17  Margarita Mail, PA-C    Inpatient Medications: Scheduled Meds: . aspirin EC  81 mg Oral Daily  . atorvastatin  20 mg Oral Daily  . enoxaparin (LOVENOX) injection  40 mg Subcutaneous Q24H  . insulin aspart  0-9 Units Subcutaneous Q4H  . nitroGLYCERIN  1 inch Topical Q6H   Continuous Infusions:  PRN Meds: acetaminophen, morphine injection, nitroGLYCERIN, ondansetron (ZOFRAN) IV  Allergies:    Allergies  Allergen Reactions  . Codeine Rash    Social History:   Social History   Socioeconomic History  . Marital status: Single    Spouse name: Not on file  . Number of children: Not on file  . Years of education: Not on file  . Highest education level: Not on file  Occupational History  . Not on file  Social Needs  . Financial resource strain: Not on file  . Food insecurity    Worry: Not on file    Inability: Not on file  . Transportation needs    Medical: Not on file    Non-medical: Not on file  Tobacco Use  . Smoking status: Never Smoker  . Smokeless tobacco: Never Used  Substance and Sexual Activity  . Alcohol use: No  . Drug use: No  . Sexual activity: Not on file  Lifestyle  . Physical activity    Days per week: Not on file    Minutes per session: Not on file  . Stress: Not on file  Relationships  . Social Herbalist on phone: Not on file    Gets together: Not on file    Attends religious service: Not on file    Active member of club or organization: Not on file    Attends meetings of clubs or organizations: Not on file    Relationship status: Not on file  . Intimate partner violence    Fear of current or ex partner: Not on file    Emotionally abused: Not on file     Physically abused: Not on file    Forced sexual activity: Not on file  Other Topics Concern  . Not on file  Social History Narrative  . Not on file    Family History:    Family History  Problem Relation Age of Onset  . Breast cancer Mother   . Heart attack Father      ROS:  Please see the history of present illness.   All other ROS reviewed and negative.     Physical Exam/Data:   Vitals:   06/03/19 0500 06/03/19 0600 06/03/19 0800 06/03/19 0900  BP: (!) 95/46 (!) [id number]  105/70 (!) 101/56  Pulse: (!) 49 (!) 48 62 (!) 144  Resp: 13 16 14 20   Temp:      TempSrc:      SpO2: 94% 93% 96% 95%  Weight:      Height:        Intake/Output Summary (Last 24 hours) at 06/03/2019 0933 Last data filed at 06/02/2019 2340 Gross per 24 hour  Intake 360 ml  Output -  Net 360 ml   Filed Weights   06/02/19 1852 06/02/19 2320  Weight: 77.1 kg 78.1 kg   Body mass index is 28.64 kg/m.  General:  Well nourished, well developed, in no acute distress HEENT: sclera anicteric  Neck: no JVD Vascular: No carotid bruits; distal pulses 2+ bilaterally Cardiac:  normal S1, S2; RRR; no murmurs, rubs, or gallops Lungs:  clear to auscultation bilaterally, no wheezing, rhonchi or rales  Abd: NABS, soft, nontender, no hepatomegaly Ext: no edema Musculoskeletal:  No deformities, BUE and BLE strength normal and equal Skin: warm and dry  Neuro:  CNs 2-12 intact, no focal abnormalities noted Psych:  Normal affect   EKG:  The EKG was personally reviewed and demonstrates:  sinus rhythm with sinus arrhythmia and PACs, no STE/D, no TWI.  Telemetry:  Telemetry was personally reviewed and demonstrates:  Sinus bradycardia with sinus arrhythmia/PACs  Relevant CV Studies:  Left heart catheterization 2014: Coronary angiography: Coronary dominance: right  Left mainstem: Normal.   Left anterior descending (LAD): Normal  Left circumflex (LCx):  There is mld ectasia in the distal vessel. Otherwise  normal.  Right coronary artery (RCA): Normal.  Left ventriculography: Left ventricular systolic function is normal, LVEF is estimated at 55-65%, there is no significant mitral regurgitation   Final Conclusions:   1. No significant CAD 2. Normal LV function.  Recommendations: Will resume coumadin. Start PPI. OK for discharge later today.  Echocardiogram 2014: Study Conclusions   Left ventricle: The cavity size was normal. Wall thickness was normal. Systolic function was normal. The estimated ejection fraction was in the range of 60% to 65%.   Laboratory Data:  Chemistry Recent Labs  Lab 06/02/19 1902  NA 138  K 3.7  CL 102  CO2 25  GLUCOSE 100*  BUN 16  CREATININE 0.92  CALCIUM 9.8  GFRNONAA >60  GFRAA >60  ANIONGAP 11    Recent Labs  Lab 06/02/19 1902  PROT 7.4  ALBUMIN 4.7  AST 26  ALT 39  ALKPHOS 68  BILITOT 0.6   Hematology Recent Labs  Lab 06/02/19 1902  WBC 6.6  RBC 4.30  HGB 14.0  HCT 43.0  MCV 100.0  MCH 32.6  MCHC 32.6  RDW 12.8  PLT 189   Cardiac EnzymesNo results for input(s): TROPONINI in the last 168 hours. No results for input(s): TROPIPOC in the last 168 hours.  BNPNo results for input(s): BNP, PROBNP in the last 168 hours.  DDimer  Recent Labs  Lab 06/02/19 1902  DDIMER <0.27    Radiology/Studies:  Dg Chest Port 1 View  Result Date: 06/02/2019 CLINICAL DATA:  Chest pain EXAM: PORTABLE CHEST 1 VIEW COMPARISON:  CT August 22, 2013, radiograph 02/20/2007 FINDINGS: The heart size and mediastinal contours are within normal limits. Both lungs are clear. The visualized skeletal structures are unremarkable. Clips over the left chest and breast. IMPRESSION: No active disease. Electronically Signed   By: Donavan Foil M.D.   On: 06/02/2019 19:24    Assessment and Plan:   1. Chest pain:  patient presented with substernal chest pain radiating to her left shoulder/arm which was relieved with nitro. EKG non-ischemic. HsT negative x3.  She had a cardiac catheterization in 2014 to evaluate chest pain complaints which revealed normal coronaries. Echo in 2014 with normal LV function and no valvular abnormalities. She was started on a PPI at that time for possible GI etiology of pain. Symptoms atypical.  - Given negative work-up this admission and clean cath in 2014, favor watchful waiting to determine if symptoms reoccur.  - No further inpatient ischemic evaluation. Patient can follow outpatient as needed if pain reoccurs.   2. HTN: BP on the soft side here - Continue home losartan at discharge  3. HLD: no recent lipids on file - Will check FLP this morning - Continue atorvastatin  4. DM type 2: HgbA1C 6.8 today; goal <7 - Continue metformin at discharge  5. Remote DVT/PE: no longer on anticoagulation. Ddimer negative this admission.      For questions or updates, please contact [facility] Please consult [email or web address] for contact info under Cardiology/STEMI.   Signed, Abigail Butts, PA-C  06/03/2019 9:33 AM [phone]

## 2019-10-14 ENCOUNTER — Other Ambulatory Visit: Payer: Self-pay | Admitting: Surgery

## 2019-10-14 DIAGNOSIS — Z853 Personal history of malignant neoplasm of breast: Secondary | ICD-10-CM

## 2019-10-15 ENCOUNTER — Other Ambulatory Visit (HOSPITAL_BASED_OUTPATIENT_CLINIC_OR_DEPARTMENT_OTHER): Payer: Self-pay | Admitting: Surgery

## 2019-10-15 DIAGNOSIS — Z853 Personal history of malignant neoplasm of breast: Secondary | ICD-10-CM

## 2019-10-19 ENCOUNTER — Other Ambulatory Visit: Payer: Self-pay

## 2019-10-19 ENCOUNTER — Ambulatory Visit (HOSPITAL_BASED_OUTPATIENT_CLINIC_OR_DEPARTMENT_OTHER)
Admission: RE | Admit: 2019-10-19 | Discharge: 2019-10-19 | Disposition: A | Discharge status: Home / Self Care | Payer: Medicare Other | Source: Ambulatory Visit | Attending: Surgery | Admitting: Surgery

## 2019-10-19 DIAGNOSIS — Z853 Personal history of malignant neoplasm of breast: Secondary | ICD-10-CM | POA: Insufficient documentation

## 2019-10-19 MED ORDER — IOHEXOL 300 MG/ML  SOLN
80.0000 mL | Freq: Once | INTRAMUSCULAR | Status: AC | PRN
  Administered 2019-10-19: 18:00:00 80 mL via INTRAVENOUS

## 2019-10-20 ENCOUNTER — Inpatient Hospital Stay (HOSPITAL_BASED_OUTPATIENT_CLINIC_OR_DEPARTMENT_OTHER): Admission: RE | Admit: 2019-10-20 | Payer: Medicare Other | Source: Ambulatory Visit

## 2019-10-20 ENCOUNTER — Ambulatory Visit: Payer: Medicare Other | Attending: Internal Medicine

## 2019-10-20 DIAGNOSIS — Z23 Encounter for immunization: Secondary | ICD-10-CM

## 2019-10-20 NOTE — Progress Notes (Signed)
   Covid-19 Vaccination Clinic  Name:  Sherry Schultz    MRN: [id number] DOB: [birth year]  10/20/2019  [name] was observed post Covid-19 immunization for 15 minutes without incidence. She was provided with Vaccine Information Sheet and instruction to access the V-Safe system.   [name] was instructed to call 911 with any severe reactions post vaccine: Marland Kitchen Difficulty breathing  . Swelling of your face and throat  . A fast heartbeat  . A bad rash all over your body  . Dizziness and weakness    Immunizations Administered    Name Date Dose VIS Date Route   Pfizer COVID-19 Vaccine 10/20/2019  6:50 PM 0.3 mL 09/10/2019 Intramuscular   Manufacturer: [place]   Lot: [id number]   [place]: [id number]

## 2019-10-27 ENCOUNTER — Other Ambulatory Visit: Payer: Medicare Other

## 2019-11-10 ENCOUNTER — Ambulatory Visit: Payer: Medicare Other | Attending: Internal Medicine

## 2019-11-10 DIAGNOSIS — Z23 Encounter for immunization: Secondary | ICD-10-CM | POA: Insufficient documentation

## 2019-11-10 NOTE — Progress Notes (Signed)
   Covid-19 Vaccination Clinic  Name:  Sherry Schultz    MRN: [id number] DOB: Dec 26, 1955  11/10/2019  [name] was observed post Covid-19 immunization for 15 minutes without incidence. She was provided with Vaccine Information Sheet and instruction to access the V-Safe system.   [name] was instructed to call 911 with any severe reactions post vaccine: Marland Kitchen Difficulty breathing  . Swelling of your face and throat  . A fast heartbeat  . A bad rash all over your body  . Dizziness and weakness    Immunizations Administered    Name Date Dose VIS Date Route   Pfizer COVID-19 Vaccine 11/10/2019  3:50 PM 0.3 mL 09/10/2019 Intramuscular   Manufacturer: [place]   Lot: [id number]   [place]: [id number]

## 2020-07-03 ENCOUNTER — Other Ambulatory Visit: Payer: Self-pay | Admitting: Family

## 2020-07-03 ENCOUNTER — Telehealth: Payer: Self-pay | Admitting: Family

## 2020-07-03 ENCOUNTER — Other Ambulatory Visit (HOSPITAL_COMMUNITY): Payer: Self-pay

## 2020-07-03 DIAGNOSIS — U071 COVID-19: Secondary | ICD-10-CM

## 2020-07-03 NOTE — Telephone Encounter (Signed)
I connected by phone with Sherry Schultz on 07/03/2020 at 12:20 PM to discuss the potential use of a new treatment for mild to moderate COVID-19 viral infection in non-hospitalized patients.  This patient is a 64 y.o. female that meets the FDA criteria for Emergency Use Authorization of COVID monoclonal antibody casirivimab/imdevimab or bamlanivimab/eteseviamb.  Has a (+) direct SARS-CoV-2 viral test result  Has mild or moderate COVID-19   Is NOT hospitalized due to COVID-19  Is within 10 days of symptom onset  Has at least one of the high risk factor(s) for progression to severe COVID-19 and/or hospitalization as defined in EUA.  Specific high risk criteria : Older age (>/= 66 yo), Diabetes and Cardiovascular disease or hypertension   I have spoken and communicated the following to the patient or parent/caregiver regarding COVID monoclonal antibody treatment:  1. FDA has authorized the emergency use for the treatment of mild to moderate COVID-19 in adults and pediatric patients with positive results of direct SARS-CoV-2 viral testing who are 36 years of age and older weighing at least 40 kg, and who are at high risk for progressing to severe COVID-19 and/or hospitalization.  2. The significant known and potential risks and benefits of COVID monoclonal antibody, and the extent to which such potential risks and benefits are unknown.  3. Information on available alternative treatments and the risks and benefits of those alternatives, including clinical trials.  4. Patients treated with COVID monoclonal antibody should continue to self-isolate and use infection control measures (e.g., wear mask, isolate, social distance, avoid sharing personal items, clean and disinfect "high touch" surfaces, and frequent handwashing) according to CDC guidelines.   5. The patient or parent/caregiver has the option to accept or refuse COVID monoclonal antibody treatment.  After reviewing this information  with the patient, the patient has agreed to receive one of the available covid 19 monoclonal antibodies and will be provided an appropriate fact sheet prior to infusion. Loel Dubonnet, NP 07/03/2020 12:20 PM

## 2020-07-04 ENCOUNTER — Ambulatory Visit (HOSPITAL_COMMUNITY)
Admission: RE | Admit: 2020-07-04 | Discharge: 2020-07-04 | Disposition: A | Discharge status: Home / Self Care | Payer: Medicare Other | Source: Ambulatory Visit | Attending: Pulmonary Disease | Admitting: Pulmonary Disease

## 2020-07-04 DIAGNOSIS — U071 COVID-19: Secondary | ICD-10-CM | POA: Diagnosis present

## 2020-07-04 DIAGNOSIS — Z23 Encounter for immunization: Secondary | ICD-10-CM | POA: Insufficient documentation

## 2020-07-04 MED ORDER — SODIUM CHLORIDE 0.9 % IV SOLN: INTRAVENOUS | Status: DC | PRN

## 2020-07-04 MED ORDER — METHYLPREDNISOLONE SODIUM SUCC 125 MG IJ SOLR: 125.0000 mg | Freq: Once | INTRAMUSCULAR | Status: DC | PRN

## 2020-07-04 MED ORDER — SODIUM CHLORIDE 0.9 % IV SOLN
1200.0000 mg | Freq: Once | INTRAVENOUS | Status: AC
  Administered 2020-07-04: 1200 mg via INTRAVENOUS

## 2020-07-04 MED ORDER — ALBUTEROL SULFATE HFA 108 (90 BASE) MCG/ACT IN AERS: 2.0000 | INHALATION_SPRAY | Freq: Once | RESPIRATORY_TRACT | Status: DC | PRN

## 2020-07-04 MED ORDER — EPINEPHRINE 0.3 MG/0.3ML IJ SOAJ: 0.3000 mg | Freq: Once | INTRAMUSCULAR | Status: DC | PRN

## 2020-07-04 MED ORDER — FAMOTIDINE IN NACL 20-0.9 MG/50ML-% IV SOLN: 20.0000 mg | Freq: Once | INTRAVENOUS | Status: DC | PRN

## 2020-07-04 MED ORDER — DIPHENHYDRAMINE HCL 50 MG/ML IJ SOLN: 50.0000 mg | Freq: Once | INTRAMUSCULAR | Status: DC | PRN

## 2020-07-04 NOTE — Progress Notes (Signed)
  Diagnosis: COVID-19  Physician: Dr. Joya Gaskins  Procedure: Covid Infusion Clinic Med: casirivimab\imdevimab infusion - Provided patient with casirivimab\imdevimab fact sheet for patients, parents and caregivers prior to infusion.  Complications: No immediate complications noted.  Discharge: Discharged home   Rejeana Brock 07/04/2020

## 2020-07-04 NOTE — Discharge Instructions (Signed)

## 2021-07-27 ENCOUNTER — Other Ambulatory Visit: Payer: Self-pay

## 2021-07-27 ENCOUNTER — Emergency Department (HOSPITAL_BASED_OUTPATIENT_CLINIC_OR_DEPARTMENT_OTHER)
Admission: EM | Admit: 2021-07-27 | Discharge: 2021-07-27 | Disposition: A | Discharge status: Home / Self Care | Payer: Medicare Other | Attending: Emergency Medicine | Admitting: Emergency Medicine

## 2021-07-27 ENCOUNTER — Emergency Department (HOSPITAL_BASED_OUTPATIENT_CLINIC_OR_DEPARTMENT_OTHER): Payer: Medicare Other

## 2021-07-27 ENCOUNTER — Encounter (HOSPITAL_BASED_OUTPATIENT_CLINIC_OR_DEPARTMENT_OTHER): Payer: Self-pay | Admitting: *Deleted

## 2021-07-27 DIAGNOSIS — W01198A Fall on same level from slipping, tripping and stumbling with subsequent striking against other object, initial encounter: Secondary | ICD-10-CM | POA: Insufficient documentation

## 2021-07-27 DIAGNOSIS — M79671 Pain in right foot: Secondary | ICD-10-CM | POA: Insufficient documentation

## 2021-07-27 DIAGNOSIS — Z79899 Other long term (current) drug therapy: Secondary | ICD-10-CM | POA: Diagnosis not present

## 2021-07-27 DIAGNOSIS — Z7982 Long term (current) use of aspirin: Secondary | ICD-10-CM | POA: Insufficient documentation

## 2021-07-27 DIAGNOSIS — I10 Essential (primary) hypertension: Secondary | ICD-10-CM | POA: Insufficient documentation

## 2021-07-27 DIAGNOSIS — Z7984 Long term (current) use of oral hypoglycemic drugs: Secondary | ICD-10-CM | POA: Insufficient documentation

## 2021-07-27 DIAGNOSIS — E119 Type 2 diabetes mellitus without complications: Secondary | ICD-10-CM | POA: Diagnosis not present

## 2021-07-27 DIAGNOSIS — Z853 Personal history of malignant neoplasm of breast: Secondary | ICD-10-CM | POA: Insufficient documentation

## 2021-07-27 NOTE — Discharge Instructions (Addendum)
You likely have a bad sprain of your foot.  Please schedule a follow up appointment with your orthopedic doctor in 7-10 days.  If you are STILL not able to bear full weight on your foot after 7 days, you may need repeat xrays of your ankle and foot.

## 2021-07-27 NOTE — ED Triage Notes (Signed)
Right ankle injury. She tripped and fell. She hit her head. No blood thinners.

## 2021-07-27 NOTE — ED Provider Notes (Signed)
[place] EMERGENCY DEPARTMENT Provider Note   CSN: [id number] Arrival date & time: 07/27/21  1839     History Chief Complaint  Patient presents with   Ankle Injury    Sherry Schultz is a 65 y.o. female presenting to emergency department with pain in his right foot.  Patient ports he had a mechanical fall today and think she tripped on the sidewalk, twisting her right ankle and landing on the pavement.  She may have struck her forehead on the pavement.  There is no loss of consciousness.  This occurred around 5 PM.  She is not on blood thinners.  No other injuries reported.  She denies lightheadedness or history of syncope.  She states she is unable to bear weight on her foot due to the pain.  HPI     Past Medical History:  Diagnosis Date   Breast cancer ([facility])    Diabetes mellitus without complication ([facility])    DVT (deep venous thrombosis) ([facility])    High cholesterol    Hypertension    Kidney stone    Personal history of chemotherapy    Personal history of radiation therapy    Pulmonary emboli [facility])     Patient Active Problem List   Diagnosis Date Noted   Chest pain 08/22/2013   Essential hypertension 08/22/2013   T2DM (type 2 diabetes mellitus) ([facility]) 08/22/2013   Dyslipidemia 08/22/2013    Past Surgical History:  Procedure Laterality Date   BREAST SURGERY  2005   left mastectomy   LEFT HEART CATHETERIZATION WITH CORONARY ANGIOGRAM N/A 08/24/2013   Procedure: LEFT HEART CATHETERIZATION WITH CORONARY ANGIOGRAM;  Surgeon: Peter M Martinique, MD;  [facility] LAB;  Service: Cardiovascular;  Laterality: N/A;   MASTECTOMY Bilateral     malignant, right benign   SALPINGOOPHORECTOMY  10/2018     OB History   No obstetric history on file.     Family History  Problem Relation Age of Onset   Breast cancer Mother    Heart attack Father     Social History   Tobacco Use   Smoking status: Never   Smokeless tobacco: Never  Vaping Use   Vaping Use:  Never used  Substance Use Topics   Alcohol use: No   Drug use: No    Home Medications Prior to Admission medications   Medication Sig Start Date End Date Taking? Authorizing Provider  acetaminophen (TYLENOL) 500 MG tablet Take 1,000 mg by mouth every 6 (six) hours as needed for mild pain or headache.    [provider]  aspirin EC 81 MG tablet Take 81 mg by mouth daily. 12/23/16   [provider]  atorvastatin (LIPITOR) 20 MG tablet Take 20 mg by mouth daily.    [provider]  Calcium Carbonate-Vit D-Min (CALCIUM 1200 PO) Take 1,200 mg by mouth 2 (two) times daily.    [provider]  losartan (COZAAR) 50 MG tablet Take 0.5 tablets (25 mg total) by mouth daily. Patient taking differently: Take 50 mg by mouth daily.  08/24/13   Dunn, Nedra Hai, PA-C  metFORMIN (GLUCOPHAGE) 500 MG tablet Take 1 tablet (500 mg total) by mouth 2 (two) times daily with a meal. 08/26/13   Dunn, Nedra Hai, PA-C  Multiple Vitamin (MULTIVITAMIN WITH MINERALS) TABS tablet Take 1 tablet by mouth daily.    [provider]  omeprazole (PRILOSEC) 20 MG capsule Take 1 capsule (20 mg total) by mouth daily. Patient taking differently: Take 20  mg by mouth 2 (two) times daily before a meal.  08/24/13   Dunn, Dayna N, PA-C  PARoxetine (PAXIL) 20 MG tablet Take 20 mg by mouth 2 (two) times daily. 05/26/17   [provider]    Allergies    Hydromorphone, Codeine, and Nitrofurantoin  Review of Systems   Review of Systems  Constitutional:  Negative for chills and fever.  Eyes:  Negative for pain and visual disturbance.  Respiratory:  Negative for cough and shortness of breath.   Cardiovascular:  Negative for chest pain and palpitations.  Gastrointestinal:  Negative for abdominal pain and vomiting.  Musculoskeletal:  Positive for arthralgias and myalgias.  Skin:  Negative for color change and rash.  Neurological:  Negative for syncope, weakness, light-headedness, numbness  and headaches.  All other systems reviewed and are negative.  Physical Exam Updated Vital Signs BP (!) 145/75 (BP Location: Right Arm)   Pulse 64   Temp 98.2 F (36.8 C) (Oral)   Resp 16   Ht 5\' 5"  (1.651 m)   Wt 74.4 kg   SpO2 98%   BMI 27.29 kg/m   Physical Exam Constitutional:      General: She is not in acute distress. HENT:     Head: Normocephalic and atraumatic.  Eyes:     Conjunctiva/sclera: Conjunctivae normal.     Pupils: Pupils are equal, round, and reactive to light.  Cardiovascular:     Rate and Rhythm: Normal rate and regular rhythm.  Pulmonary:     Effort: Pulmonary effort is normal. No respiratory distress.  Abdominal:     General: There is no distension.     Tenderness: There is no abdominal tenderness.  Musculoskeletal:     Comments: Tenderness tracking along flexor hallicus longus tendon Mild anterior talofibilar swelling and tenderness No ttp at the base of the 5th metatarsal  Skin:    General: Skin is warm and dry.  Neurological:     General: No focal deficit present.     Mental Status: She is alert and oriented to person, place, and time. Mental status is at baseline.  Psychiatric:        Mood and Affect: Mood normal.        Behavior: Behavior normal.    ED Results / Procedures / Treatments   Labs (all labs ordered are listed, but only abnormal results are displayed) Labs Reviewed - No data to display  EKG None  Radiology DG Ankle Complete Right  Result Date: 07/27/2021 CLINICAL DATA:  Ankle pain EXAM: RIGHT ANKLE - COMPLETE 3+ VIEW COMPARISON:  None. FINDINGS: No fracture or malalignment. Ossicle or old injury adjacent to the fibular malleolus. Ankle mortise is symmetric. Small plantar calcaneal spur. IMPRESSION: No definite acute osseous abnormality. Electronically Signed   By: Donavan Foil M.D.   On: 07/27/2021 20:25    Procedures Procedures   Medications Ordered in ED Medications - No data to display  ED Course  I have  reviewed the triage vital signs and the nursing notes.  Pertinent labs & imaging results that were available during my care of the patient were reviewed by me and considered in my medical decision making (see chart for details).  Suspected ligament or tendon injury of the ankle or foot.  Not able to bear full weight.  Neurovascularly intact.  No significant swelling on exam.  I would advise CAM boot at home, light toe touches, she has a walker at home that she can use.  She has her  own orthopedic she prefers to follow-up with, would recommend follow-up in 7 to 10 days for repeat films if she is unable to bear weight.  She verbalized understanding.  Her sister will be taking her home.  Doubt ICH or significant head injury.  Not on a/c, no report of headache or neuro symptoms after multiple hours in the ED.       Final Clinical Impression(s) / ED Diagnoses Final diagnoses:  Right foot pain    Rx / DC Orders ED Discharge Orders     None        Wyvonnia Dusky, MD 07/28/21 1013

## 2022-01-03 ENCOUNTER — Encounter (HOSPITAL_BASED_OUTPATIENT_CLINIC_OR_DEPARTMENT_OTHER): Payer: Self-pay | Admitting: Emergency Medicine

## 2022-01-03 ENCOUNTER — Other Ambulatory Visit: Payer: Self-pay

## 2022-01-03 ENCOUNTER — Emergency Department (HOSPITAL_BASED_OUTPATIENT_CLINIC_OR_DEPARTMENT_OTHER)
Admission: EM | Admit: 2022-01-03 | Discharge: 2022-01-03 | Disposition: A | Discharge status: Home / Self Care | Payer: Medicare Other | Attending: Emergency Medicine | Admitting: Emergency Medicine

## 2022-01-03 ENCOUNTER — Other Ambulatory Visit (HOSPITAL_BASED_OUTPATIENT_CLINIC_OR_DEPARTMENT_OTHER): Payer: Self-pay

## 2022-01-03 ENCOUNTER — Emergency Department (HOSPITAL_BASED_OUTPATIENT_CLINIC_OR_DEPARTMENT_OTHER): Payer: Medicare Other

## 2022-01-03 DIAGNOSIS — R350 Frequency of micturition: Secondary | ICD-10-CM | POA: Diagnosis not present

## 2022-01-03 DIAGNOSIS — Z853 Personal history of malignant neoplasm of breast: Secondary | ICD-10-CM | POA: Diagnosis not present

## 2022-01-03 DIAGNOSIS — R509 Fever, unspecified: Secondary | ICD-10-CM | POA: Diagnosis present

## 2022-01-03 DIAGNOSIS — Z7982 Long term (current) use of aspirin: Secondary | ICD-10-CM | POA: Diagnosis not present

## 2022-01-03 DIAGNOSIS — I10 Essential (primary) hypertension: Secondary | ICD-10-CM | POA: Diagnosis not present

## 2022-01-03 DIAGNOSIS — E119 Type 2 diabetes mellitus without complications: Secondary | ICD-10-CM | POA: Diagnosis not present

## 2022-01-03 DIAGNOSIS — Z79899 Other long term (current) drug therapy: Secondary | ICD-10-CM | POA: Diagnosis not present

## 2022-01-03 DIAGNOSIS — Z7984 Long term (current) use of oral hypoglycemic drugs: Secondary | ICD-10-CM | POA: Diagnosis not present

## 2022-01-03 DIAGNOSIS — R1084 Generalized abdominal pain: Secondary | ICD-10-CM | POA: Insufficient documentation

## 2022-01-03 DIAGNOSIS — N3000 Acute cystitis without hematuria: Secondary | ICD-10-CM

## 2022-01-03 LAB — CBC WITH DIFFERENTIAL/PLATELET
Abs Immature Granulocytes: 0.03 10*3/uL (ref 0.00–0.07)
Basophils Absolute: 0 10*3/uL (ref 0.0–0.1)
Basophils Relative: 1 %
Eosinophils Absolute: 0.3 10*3/uL (ref 0.0–0.5)
Eosinophils Relative: 4 %
HCT: 41.7 % (ref 36.0–46.0)
Hemoglobin: 13.9 g/dL (ref 12.0–15.0)
Immature Granulocytes: 0 %
Lymphocytes Relative: 22 %
Lymphs Abs: 1.6 10*3/uL (ref 0.7–4.0)
MCH: 33.4 pg (ref 26.0–34.0)
MCHC: 33.3 g/dL (ref 30.0–36.0)
MCV: 100.2 fL — ABNORMAL HIGH (ref 80.0–100.0)
Monocytes Absolute: 0.9 10*3/uL (ref 0.1–1.0)
Monocytes Relative: 12 %
Neutro Abs: 4.4 10*3/uL (ref 1.7–7.7)
Neutrophils Relative %: 61 %
Platelets: 159 10*3/uL (ref 150–400)
RBC: 4.16 MIL/uL (ref 3.87–5.11)
RDW: 12.8 % (ref 11.5–15.5)
WBC: 7.2 10*3/uL (ref 4.0–10.5)
nRBC: 0 % (ref 0.0–0.2)

## 2022-01-03 LAB — COMPREHENSIVE METABOLIC PANEL
ALT: 21 U/L (ref 0–44)
AST: 16 U/L (ref 15–41)
Albumin: 3.9 g/dL (ref 3.5–5.0)
Alkaline Phosphatase: 59 U/L (ref 38–126)
Anion gap: 7 (ref 5–15)
BUN: 18 mg/dL (ref 8–23)
CO2: 26 mmol/L (ref 22–32)
Calcium: 9.5 mg/dL (ref 8.9–10.3)
Chloride: 105 mmol/L (ref 98–111)
Creatinine, Ser: 0.8 mg/dL (ref 0.44–1.00)
GFR, Estimated: >60 mL/min (ref >60)
Glucose, Bld: 92 mg/dL (ref 70–99)
Potassium: 4.6 mmol/L (ref 3.5–5.1)
Sodium: 138 mmol/L (ref 135–145)
Total Bilirubin: 0.7 mg/dL (ref 0.3–1.2)
Total Protein: 6.9 g/dL (ref 6.5–8.1)

## 2022-01-03 LAB — URINALYSIS, ROUTINE W REFLEX MICROSCOPIC
Glucose, UA: NEGATIVE mg/dL
Hgb urine dipstick: NEGATIVE
Ketones, ur: 15 mg/dL — AB
Nitrite: NEGATIVE
Protein, ur: 30 mg/dL — AB
Specific Gravity, Urine: 1.025 (ref 1.005–1.030)
pH: 5 (ref 5.0–8.0)

## 2022-01-03 LAB — URINALYSIS, MICROSCOPIC (REFLEX): WBC, UA: >50 WBC/hpf (ref 0–5)

## 2022-01-03 LAB — LIPASE, BLOOD: Lipase: 28 U/L (ref 11–51)

## 2022-01-03 MED ORDER — CEPHALEXIN 500 MG PO CAPS
500.0000 mg | ORAL_CAPSULE | Freq: Four times a day (QID) | ORAL | 0 refills | Status: AC
  Filled 2022-01-03: qty 28, 7d supply, fill #0

## 2022-01-03 MED ORDER — IOHEXOL 300 MG/ML  SOLN
100.0000 mL | Freq: Once | INTRAMUSCULAR | Status: AC | PRN
Start: 2022-01-03 — End: 2022-01-03
  Administered 2022-01-03: 100 mL via INTRAVENOUS

## 2022-01-03 NOTE — ED Triage Notes (Signed)
Pt reports urinary frequency and abdominal tenderness that started on Monday. Pt reports she was seen by PCP today and was Dx with UTI. Pt was sent by PCP for further workup due to abd tenderness.  ?

## 2022-01-03 NOTE — ED Provider Notes (Addendum)
?[name] ?Provider Note ? ? ?CSN: [id number] ?Arrival date & time: 01/03/22  1140 ? ?  ? ?History ? ?Chief Complaint  ?Patient presents with  ? Urinary Frequency  ? ? ?Sherry Schultz is a 66 y.o. female. ? ?Patient referred in by primary care doctor for the history of abdominal pain that sort of all over left and right with radiation to the back for about a months.  Has been a little bit worse lately.  On Tuesday night she started with fevers.  Patient states she still having fevers intermittently.  No nausea vomiting or diarrhea.  Patient had a urinalysis done with the primary care doctor.  Her UA here today is a little equivocal.  Did have white blood cells greater than 50 bacteria was rare and RBCs were normal.  That urines been sent for culture. ? ?Past medical history significant for diabetes without complications hypertension high cholesterol history of kidney stones history of breast cancer history of deep vein thrombosis history of pulmonary embolus.  And patient has had radiation treatment and chemotherapy related to the breast cancer.  Patient is not currently on a blood thinner.  Past surgical history significant for left heart catheterization in 2014 done by Dr. Martinique.  Left mastectomy and salpingo-oophorectomy. ? ? ?  ? ?Home Medications ?Prior to Admission medications   ?Medication Sig Start Date End Date Taking? Authorizing Provider  ?acetaminophen (TYLENOL) 500 MG tablet Take 1,000 mg by mouth every 6 (six) hours as needed for mild pain or headache.    [provider]  ?aspirin EC 81 MG tablet Take 81 mg by mouth daily. 12/23/16   [provider]  ?atorvastatin (LIPITOR) 20 MG tablet Take 20 mg by mouth daily.    [provider]  ?Calcium Carbonate-Vit D-Min (CALCIUM 1200 PO) Take 1,200 mg by mouth 2 (two) times daily.    [provider]  ?losartan (COZAAR) 50 MG tablet Take 0.5 tablets (25 mg total) by mouth daily. ?Patient  taking differently: Take 50 mg by mouth daily.  08/24/13   Dunn, Nedra Hai, PA-C  ?metFORMIN (GLUCOPHAGE) 500 MG tablet Take 1 tablet (500 mg total) by mouth 2 (two) times daily with a meal. 08/26/13   Charlie Pitter, PA-C  ?Multiple Vitamin (MULTIVITAMIN WITH MINERALS) TABS tablet Take 1 tablet by mouth daily.    [provider]  ?omeprazole (PRILOSEC) 20 MG capsule Take 1 capsule (20 mg total) by mouth daily. ?Patient taking differently: Take 20 mg by mouth 2 (two) times daily before a meal.  08/24/13   Charlie Pitter, PA-C  ?PARoxetine (PAXIL) 20 MG tablet Take 20 mg by mouth 2 (two) times daily. 05/26/17   [provider]  ?   ? ?Allergies    ?Hydromorphone, Codeine, and Nitrofurantoin   ? ?Review of Systems   ?Review of Systems  ?Constitutional:  Positive for fever. Negative for chills.  ?HENT:  Negative for ear pain and sore throat.   ?Eyes:  Negative for pain and visual disturbance.  ?Respiratory:  Negative for cough and shortness of breath.   ?Cardiovascular:  Negative for chest pain and palpitations.  ?Gastrointestinal:  Positive for abdominal pain. Negative for vomiting.  ?Genitourinary:  Negative for dysuria and hematuria.  ?Musculoskeletal:  Positive for back pain. Negative for arthralgias.  ?Skin:  Negative for color change and rash.  ?Neurological:  Negative for seizures and syncope.  ?All other systems reviewed and are negative. ? ?Physical Exam ?Updated Vital  Signs ?BP 129/84 (BP Location: Right Arm)   Pulse 70   Temp 98.1 ?F (36.7 ?C) (Oral)   Resp 16   SpO2 99%  ?Physical Exam ?Vitals and nursing note reviewed.  ?Constitutional:   ?   General: She is not in acute distress. ?   Appearance: Normal appearance. She is well-developed.  ?HENT:  ?   Head: Normocephalic and atraumatic.  ?Eyes:  ?   Extraocular Movements: Extraocular movements intact.  ?   Conjunctiva/sclera: Conjunctivae normal.  ?   Pupils: Pupils are equal, round, and reactive to light.  ?Cardiovascular:  ?   Rate and  Rhythm: Normal rate and regular rhythm.  ?   Heart sounds: No murmur heard. ?Pulmonary:  ?   Effort: Pulmonary effort is normal. No respiratory distress.  ?   Breath sounds: Normal breath sounds. No wheezing, rhonchi or rales.  ?Abdominal:  ?   General: There is no distension.  ?   Palpations: Abdomen is soft.  ?   Tenderness: There is abdominal tenderness. There is no guarding.  ?Musculoskeletal:     ?   General: No swelling.  ?   Cervical back: Normal range of motion and neck supple.  ?Skin: ?   General: Skin is warm and dry.  ?   Capillary Refill: Capillary refill takes less than 2 seconds.  ?Neurological:  ?   General: No focal deficit present.  ?   Mental Status: She is alert and oriented to person, place, and time.  ?Psychiatric:     ?   Mood and Affect: Mood normal.  ? ? ?ED Results / Procedures / Treatments   ?Labs ?(all labs ordered are listed, but only abnormal results are displayed) ?Labs Reviewed  ?URINALYSIS, ROUTINE W REFLEX MICROSCOPIC - Abnormal; Notable for the following components:  ?    Result Value  ? Bilirubin Urine SMALL (*)   ? Ketones, ur 15 (*)   ? Protein, ur 30 (*)   ? Leukocytes,Ua SMALL (*)   ? All other components within normal limits  ?URINALYSIS, MICROSCOPIC (REFLEX) - Abnormal; Notable for the following components:  ? Bacteria, UA RARE (*)   ? All other components within normal limits  ?URINE CULTURE  ?COMPREHENSIVE METABOLIC PANEL  ?LIPASE, BLOOD  ?CBC WITH DIFFERENTIAL/PLATELET  ? ? ?EKG ?None ? ?Radiology ?No results found. ? ?Procedures ?Procedures  ? ? ?Medications Ordered in ED ?Medications - No data to display ? ?ED Course/ Medical Decision Making/ A&P ?  ?                        ?Medical Decision Making ?Amount and/or Complexity of Data Reviewed ?Labs: ordered. ?Radiology: ordered. ? ?Risk ?Prescription drug management. ? ? ?Urinalysis not classic for urinary tract infection.  Have sent it off for culture.  We will proceed with CBC complete metabolic panel lipase CT scan of  the abdomen and pelvis and will also get a chest x-ray as well. ? ?Patient's work-up here without any significant abnormalities.  Complete metabolic panel normal bleeding liver function test.  Lipase is normal complete blood count no leukocytosis hemoglobin 13.9.  Urinalysis we discussed before was slightly abnormal urine culture is pending.  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  And chest x-ray was negative as well. ? ?Based on everything being negative we will go ahead and give a  trial of Keflex for urinary tract infection while urine culture is pending and have patient follow back up with primary care provider.  Certainly no acute process ongoing today. ? ?Final Clinical Impression(s) / ED Diagnoses ?Final diagnoses:  ?Generalized abdominal pain  ?Fever, unspecified fever cause  ? ? ?Rx / DC Orders ?ED Discharge Orders   ? ? None  ? ?  ? ? ?  ?Fredia Sorrow, MD ?01/03/22 1253 ? ?  ?Fredia Sorrow, MD ?01/03/22 1439 ? ?

## 2022-01-03 NOTE — Discharge Instructions (Signed)
Work-up here today without any acute findings.  Were left with maybe a urine that slightly abnormal trial of the Keflex to see if it improves any of the symptoms.  CT scan chest x-ray negative Labs all negative.  Make an appointment follow back up with your primary care doctor.  Return for any new or worse symptoms.  Continue to take the Tylenol for the fever.  Follow-up with your primary care doctor is important. ?

## 2022-01-03 NOTE — ED Notes (Signed)
Patient transported to CT 

## 2022-01-06 LAB — URINE CULTURE: Culture: >=100000 — AB

## 2022-01-07 ENCOUNTER — Telehealth: Payer: Self-pay | Admitting: *Deleted

## 2022-01-07 NOTE — Telephone Encounter (Signed)
Post ED Visit - Positive Culture Follow-up ? ?Culture report reviewed by antimicrobial stewardship pharmacist: ?[facility] Team ?'[]'$  Elenor Quinones, Pharm.D. ?'[]'$  Heide Guile, Pharm.D., BCPS AQ-ID ?'[]'$  Parks Neptune, Pharm.D., BCPS ?'[]'$  Alycia Rossetti, Pharm.D., BCPS ?'[]'$  [place], Pharm.D., BCPS, AAHIVP ?'[]'$  Legrand Como, Pharm.D., BCPS, AAHIVP ?'[]'$  Salome Arnt, PharmD, BCPS ?'[]'$  Johnnette Gourd, PharmD, BCPS ?'[]'$  Hughes Better, PharmD, BCPS ?'[]'$  Leeroy Cha, PharmD ?'[]'$  Laqueta Linden, PharmD, BCPS ?'[]'$  Albertina Parr, PharmD ? ?[place] Team ?'[]'$  Leodis Sias, PharmD ?'[]'$  Lindell Spar, PharmD ?'[]'$  Royetta Asal, PharmD ?'[]'$  Graylin Shiver, Rph ?'[]'$  Rema Fendt) Glennon Mac, PharmD ?'[]'$  Arlyn Dunning, PharmD ?'[]'$  Netta Cedars, PharmD ?'[]'$  Dia Sitter, PharmD ?'[]'$  Leone Haven, PharmD ?'[]'$  Gretta Arab, PharmD ?'[]'$  Theodis Shove, PharmD ?'[]'$  Peggyann Juba, PharmD ?'[]'$  Reuel Boom, PharmD ? ? ?Positive urine culture ?Treated with Cephalexin, organism sensitive to the same and no further patient follow-up is required at this time.  Bertis Ruddy, PharmD ? ?Ardeen Fillers ?01/07/2022, 9:31 AM ?  ?

## 2022-11-13 ENCOUNTER — Other Ambulatory Visit (HOSPITAL_BASED_OUTPATIENT_CLINIC_OR_DEPARTMENT_OTHER): Payer: Self-pay

## 2022-12-09 ENCOUNTER — Other Ambulatory Visit (HOSPITAL_BASED_OUTPATIENT_CLINIC_OR_DEPARTMENT_OTHER): Payer: Self-pay

## 2023-06-07 ENCOUNTER — Other Ambulatory Visit: Payer: Self-pay

## 2023-06-07 ENCOUNTER — Encounter (HOSPITAL_BASED_OUTPATIENT_CLINIC_OR_DEPARTMENT_OTHER): Payer: Self-pay | Admitting: Emergency Medicine

## 2023-06-07 ENCOUNTER — Emergency Department (HOSPITAL_BASED_OUTPATIENT_CLINIC_OR_DEPARTMENT_OTHER)
Admission: EM | Admit: 2023-06-07 | Discharge: 2023-06-07 | Disposition: A | Discharge status: Home / Self Care | Payer: Medicare Other | Attending: Emergency Medicine | Admitting: Emergency Medicine

## 2023-06-07 ENCOUNTER — Emergency Department (HOSPITAL_BASED_OUTPATIENT_CLINIC_OR_DEPARTMENT_OTHER): Payer: Medicare Other

## 2023-06-07 DIAGNOSIS — W11XXXA Fall on and from ladder, initial encounter: Secondary | ICD-10-CM | POA: Insufficient documentation

## 2023-06-07 DIAGNOSIS — S060X0A Concussion without loss of consciousness, initial encounter: Secondary | ICD-10-CM | POA: Insufficient documentation

## 2023-06-07 DIAGNOSIS — Z7982 Long term (current) use of aspirin: Secondary | ICD-10-CM | POA: Diagnosis not present

## 2023-06-07 DIAGNOSIS — S0990XA Unspecified injury of head, initial encounter: Secondary | ICD-10-CM | POA: Diagnosis present

## 2023-06-07 MED ORDER — ONDANSETRON 4 MG PO TBDP: ORAL_TABLET | ORAL | 0 refills | Status: AC

## 2023-06-07 MED ORDER — ACETAMINOPHEN 500 MG PO TABS
1000.0000 mg | ORAL_TABLET | Freq: Once | ORAL | Status: AC
  Administered 2023-06-07: 1000 mg via ORAL
  Filled 2023-06-07: qty 2

## 2023-06-07 MED ORDER — METOCLOPRAMIDE HCL 5 MG/ML IJ SOLN
5.0000 mg | Freq: Once | INTRAMUSCULAR | Status: AC
  Administered 2023-06-07: 5 mg via INTRAMUSCULAR
  Filled 2023-06-07: qty 2

## 2023-06-07 MED ORDER — ONDANSETRON 4 MG PO TBDP
4.0000 mg | ORAL_TABLET | Freq: Once | ORAL | Status: AC
  Administered 2023-06-07: 4 mg via ORAL
  Filled 2023-06-07: qty 1

## 2023-06-07 MED ORDER — OXYCODONE HCL 5 MG PO TABS
5.0000 mg | ORAL_TABLET | Freq: Once | ORAL | Status: AC
  Administered 2023-06-07: 5 mg via ORAL
  Filled 2023-06-07: qty 1

## 2023-06-07 MED ORDER — DIPHENHYDRAMINE HCL 50 MG/ML IJ SOLN
12.5000 mg | Freq: Once | INTRAMUSCULAR | Status: AC
  Administered 2023-06-07: 12.5 mg via INTRAMUSCULAR
  Filled 2023-06-07: qty 1

## 2023-06-07 NOTE — ED Notes (Signed)
Discharge paperwork reviewed entirely with patient, including follow up care. Pain was under control. The patient received instruction and coaching on their prescriptions, and all follow-up questions were answered.  Pt verbalized understanding as well as all parties involved. No questions or concerns voiced at the time of discharge. No acute distress noted.   Pt ambulated out to PVA without incident or assistance.  

## 2023-06-07 NOTE — ED Provider Notes (Signed)
[facility] Provider Note   CSN: [id number] Arrival date & time: 06/07/23  1717     History  Chief Complaint  Patient presents with   Fall   Head Injury    Sherry Schultz is a 67 y.o. female.  67 yo F with a chief fall off of a ladder.  Patient fell backwards and struck the back of her head on some concrete.  Had a couple episodes of emesis immediately thereafterwards.  Denied confusion denies blood thinner use.  She denies any other specific injury.  She does have some pain at the top of her neck but has no pain with rotation of the neck.  She denies arm pain chest pain back pain.   Fall  Head Injury      Home Medications Prior to Admission medications   Medication Sig Start Date End Date Taking? Authorizing Provider  ondansetron (ZOFRAN-ODT) 4 MG disintegrating tablet 4mg  ODT q4 hours prn nausea/vomit 06/07/23  Yes Melene Plan, DO  acetaminophen (TYLENOL) 500 MG tablet Take 1,000 mg by mouth every 6 (six) hours as needed for mild pain or headache.    [provider]  aspirin EC 81 MG tablet Take 81 mg by mouth daily. 12/23/16   [provider]  atorvastatin (LIPITOR) 20 MG tablet Take 20 mg by mouth daily.    [provider]  Calcium Carbonate-Vit D-Min (CALCIUM 1200 PO) Take 1,200 mg by mouth 2 (two) times daily.    [provider]  cephALEXin (KEFLEX) 500 MG capsule Take 1 capsule (500 mg total) by mouth 4 (four) times daily. 01/03/22   Vanetta Mulders, MD  losartan (COZAAR) 50 MG tablet Take 0.5 tablets (25 mg total) by mouth daily. Patient taking differently: Take 50 mg by mouth daily.  08/24/13   Dunn, Tacey Ruiz, PA-C  metFORMIN (GLUCOPHAGE) 500 MG tablet Take 1 tablet (500 mg total) by mouth 2 (two) times daily with a meal. 08/26/13   Dunn, Tacey Ruiz, PA-C  Multiple Vitamin (MULTIVITAMIN WITH MINERALS) TABS tablet Take 1 tablet by mouth daily.    [provider]  omeprazole (PRILOSEC)  20 MG capsule Take 1 capsule (20 mg total) by mouth daily. Patient taking differently: Take 20 mg by mouth 2 (two) times daily before a meal.  08/24/13   Dunn, Dayna N, PA-C  PARoxetine (PAXIL) 20 MG tablet Take 20 mg by mouth 2 (two) times daily. 05/26/17   [provider]      Allergies    Hydromorphone, Codeine, and Nitrofurantoin    Review of Systems   Review of Systems  Physical Exam Updated Vital Signs BP 139/84 (BP Location: Right Arm)   Pulse 81   Temp 97.8 F (36.6 C) (Oral)   Resp 18   Ht 5\' 5"  (1.651 m)   Wt 72.6 kg   SpO2 99%   BMI 26.63 kg/m  Physical Exam Vitals and nursing note reviewed.  Constitutional:      General: She is not in acute distress.    Appearance: She is well-developed. She is not diaphoretic.  HENT:     Head: Normocephalic.     Comments: Occipital hematoma.  No obvious midline spinal tenderness step-offs or deformities.  Able to rotate her head 45 degrees in either direction without discomfort. Eyes:     Pupils: Pupils are equal, round, and reactive to light.  Cardiovascular:     Rate and Rhythm: Normal rate and regular rhythm.  Heart sounds: No murmur heard.    No friction rub. No gallop.  Pulmonary:     Effort: Pulmonary effort is normal.     Breath sounds: No wheezing or rales.  Abdominal:     General: There is no distension.     Palpations: Abdomen is soft.     Tenderness: There is no abdominal tenderness.  Musculoskeletal:        General: No tenderness.     Cervical back: Normal range of motion and neck supple.  Skin:    General: Skin is warm and dry.  Neurological:     Mental Status: She is alert and oriented to person, place, and time.  Psychiatric:        Behavior: Behavior normal.     ED Results / Procedures / Treatments   Labs (all labs ordered are listed, but only abnormal results are displayed) Labs Reviewed - No data to display  EKG None  Radiology CT Head Wo Contrast  Result Date:  06/07/2023 CLINICAL DATA:  Head trauma.  Severe headache.  Neck trauma. EXAM: CT HEAD WITHOUT CONTRAST CT CERVICAL SPINE WITHOUT CONTRAST TECHNIQUE: Multidetector CT imaging of the head and cervical spine was performed following the standard protocol without intravenous contrast. Multiplanar CT image reconstructions of the cervical spine were also generated. RADIATION DOSE REDUCTION: This exam was performed according to the departmental dose-optimization program which includes automated exposure control, adjustment of the mA and/or kV according to patient size and/or use of iterative reconstruction technique. COMPARISON:  None Available. FINDINGS: CT HEAD FINDINGS Brain: No evidence of acute infarction, hemorrhage, hydrocephalus, extra-axial collection or mass lesion/mass effect. Vascular: No hyperdense vessel or unexpected calcification. Skull: Normal. Negative for fracture or focal lesion. Sinuses/Orbits: No acute finding. Other: None. CT CERVICAL SPINE FINDINGS Alignment: Normal. Skull base and vertebrae: No acute fracture. No primary bone lesion or focal pathologic process. Soft tissues and spinal canal: No prevertebral fluid or swelling. No visible canal hematoma. Disc levels:  Multilevel osteoarthritic changes. Upper chest: Negative. Other: None. IMPRESSION: 1. No acute intracranial abnormality. 2. No evidence of acute traumatic injury to the cervical spine. 3. Multilevel osteoarthritic changes of the cervical spine. Electronically Signed   By: Ted Mcalpine M.D.   On: 06/07/2023 19:25   CT Cervical Spine Wo Contrast  Result Date: 06/07/2023 CLINICAL DATA:  Head trauma.  Severe headache.  Neck trauma. EXAM: CT HEAD WITHOUT CONTRAST CT CERVICAL SPINE WITHOUT CONTRAST TECHNIQUE: Multidetector CT imaging of the head and cervical spine was performed following the standard protocol without intravenous contrast. Multiplanar CT image reconstructions of the cervical spine were also generated. RADIATION DOSE  REDUCTION: This exam was performed according to the departmental dose-optimization program which includes automated exposure control, adjustment of the mA and/or kV according to patient size and/or use of iterative reconstruction technique. COMPARISON:  None Available. FINDINGS: CT HEAD FINDINGS Brain: No evidence of acute infarction, hemorrhage, hydrocephalus, extra-axial collection or mass lesion/mass effect. Vascular: No hyperdense vessel or unexpected calcification. Skull: Normal. Negative for fracture or focal lesion. Sinuses/Orbits: No acute finding. Other: None. CT CERVICAL SPINE FINDINGS Alignment: Normal. Skull base and vertebrae: No acute fracture. No primary bone lesion or focal pathologic process. Soft tissues and spinal canal: No prevertebral fluid or swelling. No visible canal hematoma. Disc levels:  Multilevel osteoarthritic changes. Upper chest: Negative. Other: None. IMPRESSION: 1. No acute intracranial abnormality. 2. No evidence of acute traumatic injury to the cervical spine. 3. Multilevel osteoarthritic changes of the cervical spine. Electronically Signed  By: Ted Mcalpine M.D.   On: 06/07/2023 19:25    Procedures Procedures    Medications Ordered in ED Medications  metoCLOPramide (REGLAN) injection 5 mg (has no administration in time range)  diphenhydrAMINE (BENADRYL) injection 12.5 mg (has no administration in time range)  acetaminophen (TYLENOL) tablet 1,000 mg (1,000 mg Oral Given 06/07/23 1736)  oxyCODONE (Oxy IR/ROXICODONE) immediate release tablet 5 mg (5 mg Oral Given 06/07/23 1736)  ondansetron (ZOFRAN-ODT) disintegrating tablet 4 mg (4 mg Oral Given 06/07/23 1828)    ED Course/ Medical Decision Making/ A&P                                 Medical Decision Making Amount and/or Complexity of Data Reviewed Radiology: ordered.  Risk OTC drugs. Prescription drug management.   67 yo F with a chief complaints of a fall off of a ladder.  She had up striking the  back of her head afterwards and an episode of emesis.  Will obtain CT imaging of the head and C-spine.  She has no other obvious area of discomfort.  CT of the head and C-spine on my independent interpretation without obvious intracranial hemorrhage or without obvious C-spine fracture.  The patient is still quite nauseated on repeat assessment.  Will give a headache cocktail here.  PCP follow-up.  7:36 PM:  I have discussed the diagnosis/risks/treatment options with the patient and family.  Evaluation and diagnostic testing in the emergency department does not suggest an emergent condition requiring admission or immediate intervention beyond what has been performed at this time.  They will follow up with PCP. We also discussed returning to the ED immediately if new or worsening sx occur. We discussed the sx which are most concerning (e.g., sudden worsening pain, fever, inability to tolerate by mouth, uncontrolled vomiting) that necessitate immediate return. Medications administered to the patient during their visit and any new prescriptions provided to the patient are listed below.  Medications given during this visit Medications  metoCLOPramide (REGLAN) injection 5 mg (has no administration in time range)  diphenhydrAMINE (BENADRYL) injection 12.5 mg (has no administration in time range)  acetaminophen (TYLENOL) tablet 1,000 mg (1,000 mg Oral Given 06/07/23 1736)  oxyCODONE (Oxy IR/ROXICODONE) immediate release tablet 5 mg (5 mg Oral Given 06/07/23 1736)  ondansetron (ZOFRAN-ODT) disintegrating tablet 4 mg (4 mg Oral Given 06/07/23 1828)     The patient appears reasonably screen and/or stabilized for discharge and I doubt any other medical condition or other [facility] requiring further screening, evaluation, or treatment in the ED at this time prior to discharge.          Final Clinical Impression(s) / ED Diagnoses Final diagnoses:  Concussion without loss of consciousness, initial encounter    Rx  / DC Orders ED Discharge Orders          Ordered    ondansetron (ZOFRAN-ODT) 4 MG disintegrating tablet        06/07/23 1934              Melene Plan, DO 06/07/23 1936

## 2023-06-07 NOTE — Discharge Instructions (Signed)
Your head CT did not show any bleeding inside the skull.  There is no obvious broken bones in your neck.  Please return for worsening headache or uncontrolled nausea.  Please follow-up with your family doctor in the office.  We are diagnosing you with a concussion.  Typically this gets worse when you use your brain.  Most of the time that is things like mathematics or language.  If you realize there is a certain activity that you are doing that is making it worse then please try to stop.

## 2023-06-07 NOTE — ED Triage Notes (Signed)
Pt missed the last step of a ladder and fell backward onto cement just PTA; c/o severe HA; no blood thinners; vomited x 1

## 2023-06-07 NOTE — ED Notes (Addendum)
Pt has returned to their room  CT followed up with this medic, advising pt threw up 1x when sitting up from the CT table. No trauma, but currently persistent nausea. EDP updated and gave verbal order for Zofran.

## 2023-06-24 IMAGING — CT CT ABD-PELV W/ CM
2 of 5 series · 16 of 46 positions shown, 18 images · IV contrast (Omnipaque)
Comparison: CT of the abdomen and pelvis on 11/29/2020 at [REDACTED]

CLINICAL DATA: Abdominal pain and fever.

EXAM:
CT ABDOMEN AND PELVIS WITH CONTRAST
TECHNIQUE: Multidetector CT imaging of the abdomen and pelvis was performed
using the standard protocol following bolus administration of
intravenous contrast.

[Series 2: axial st · axial · 0.93mm/px · z∈[-462,-36]mm · 13 of 95 slices shown, 15 images]
[im 5/95  soft-tissue]
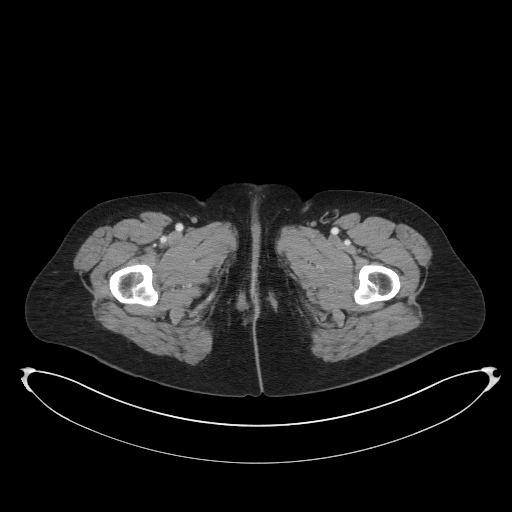
[im 5/95  bone]
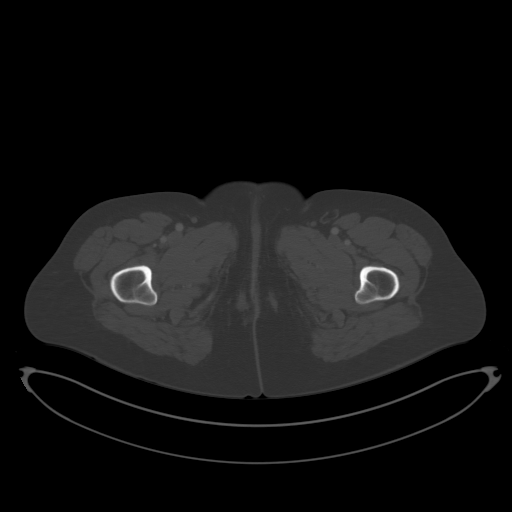
[im 15/95  soft-tissue]
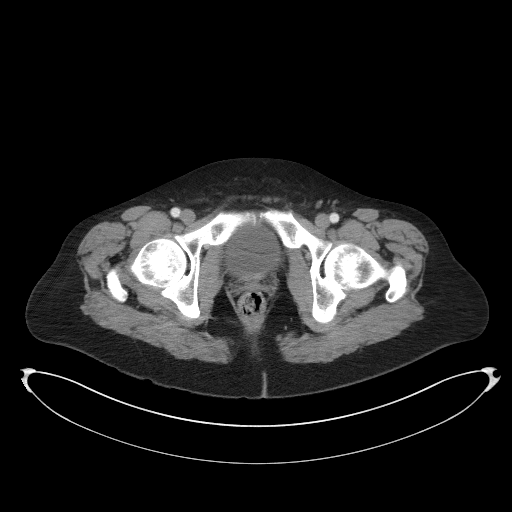
[im 20/95  soft-tissue]
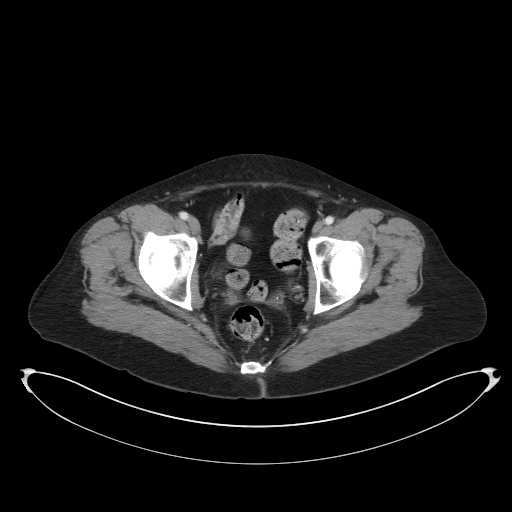
[im 25/95  soft-tissue]
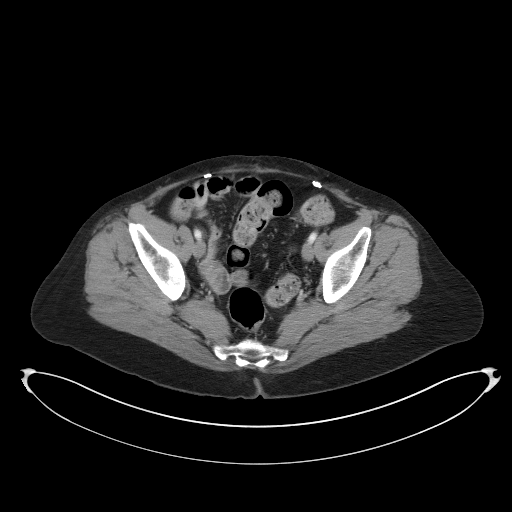
[im 35/95  soft-tissue]
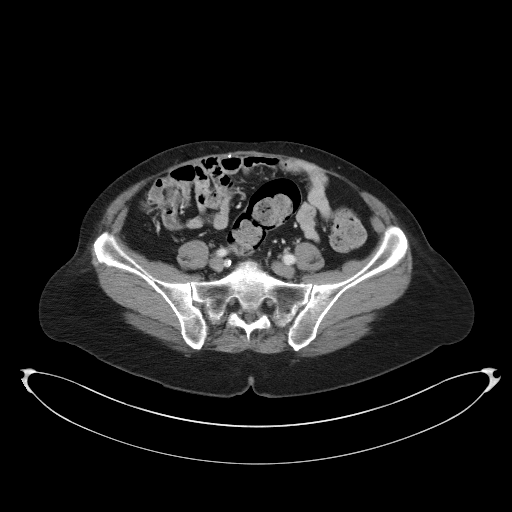
[im 40/95  soft-tissue]
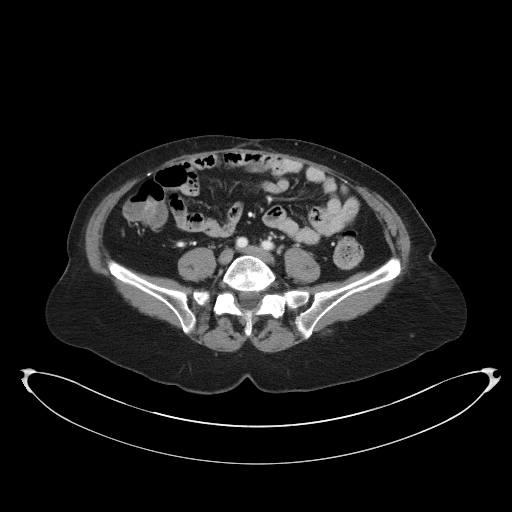
[im 50/95  soft-tissue]
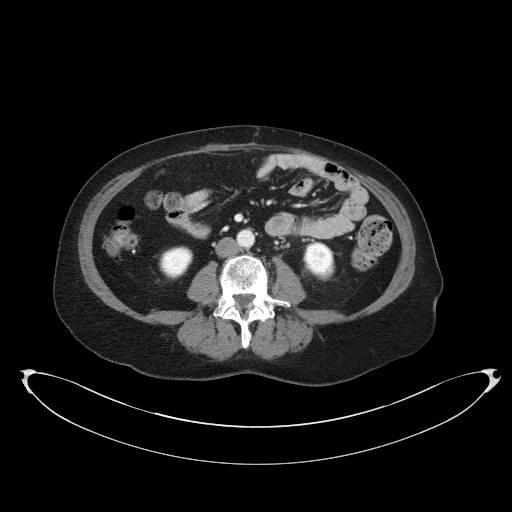
[im 55/95  soft-tissue]
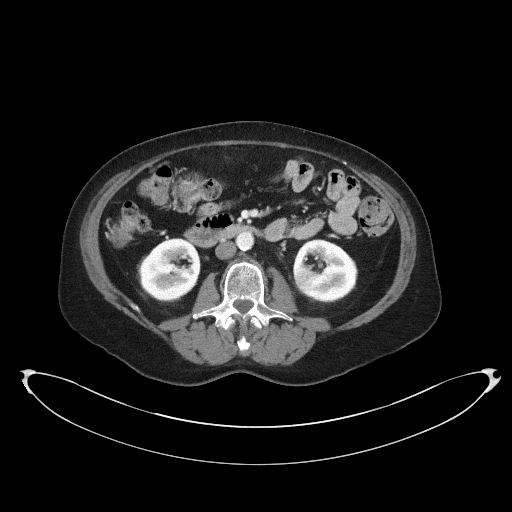
[im 60/95  soft-tissue]
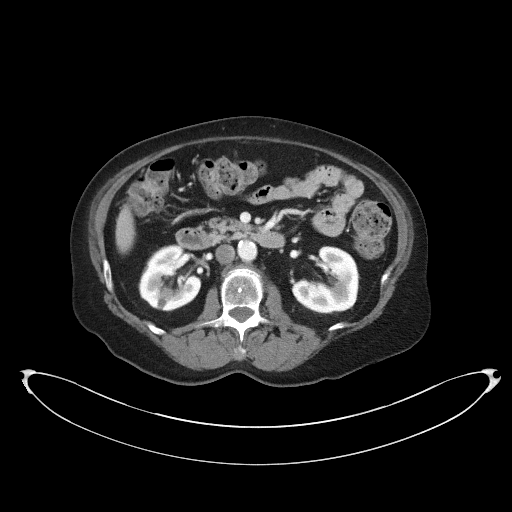
[im 60/95  bone]
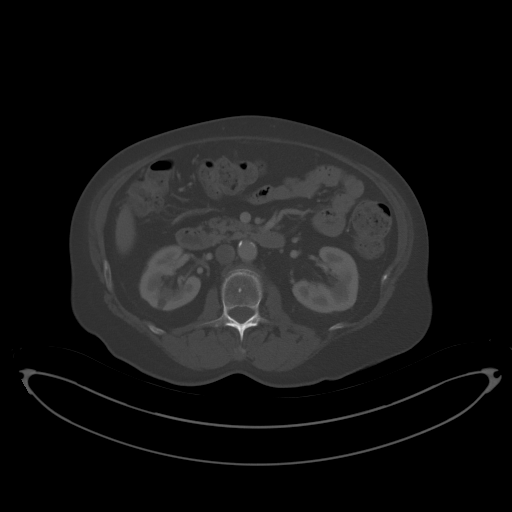
[im 70/95  soft-tissue]
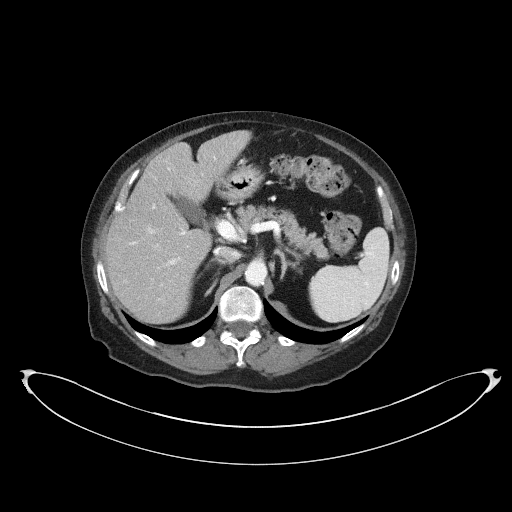
[im 75/95  soft-tissue]
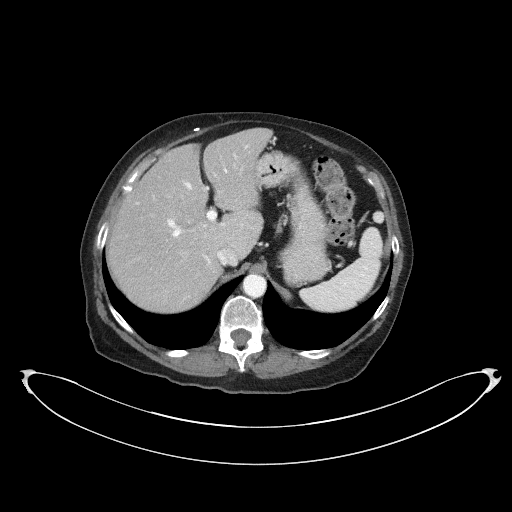
[im 80/95  soft-tissue]
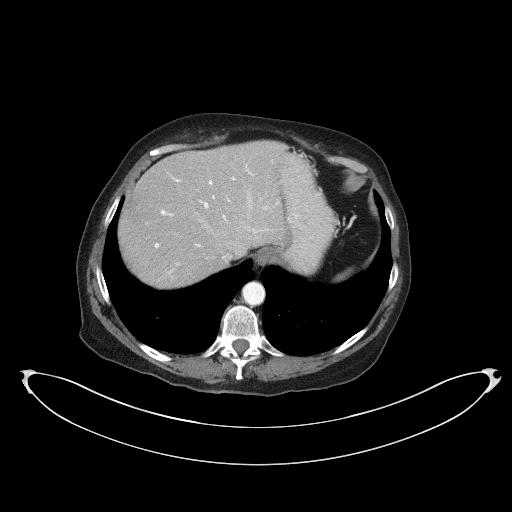
[im 90/95  soft-tissue]
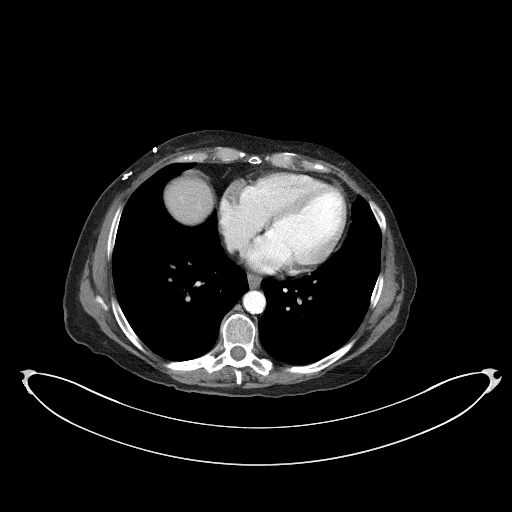

[Series 5: coronal st · coronal · 0.81mm/px · 3 of 88 slices shown]
[im 30/88  soft-tissue]
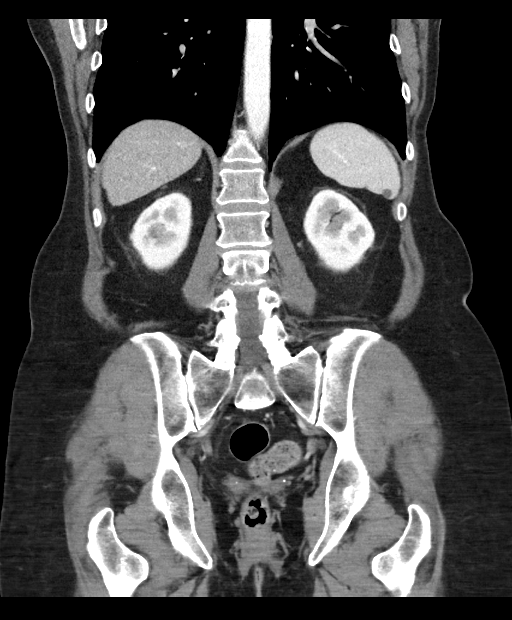
[im 39/88  soft-tissue]
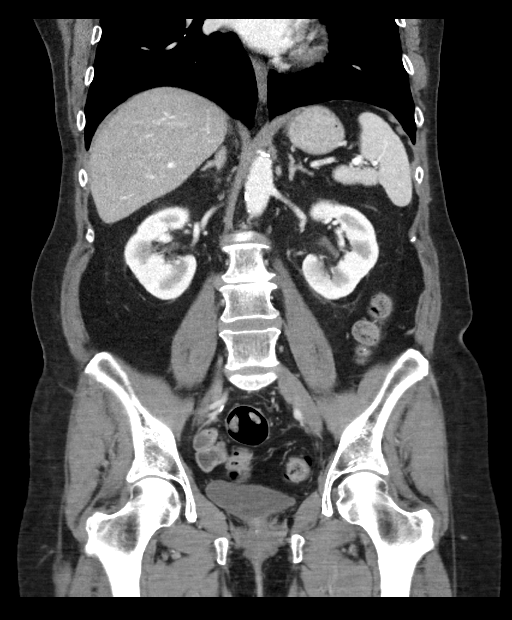
[im 49/88  soft-tissue]
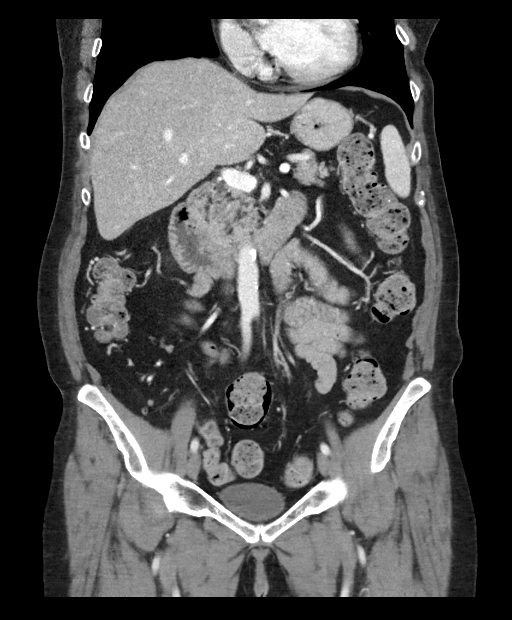

[16 of 46 positions shown; findings below may reference images not displayed]

RADIATION DOSE REDUCTION: This exam was performed according to the
departmental dose-optimization program which includes automated
exposure control, adjustment of the mA and/or kV according to
patient size and/or use of iterative reconstruction technique.

CONTRAST:  100mL OMNIPAQUE IOHEXOL 300 MG/ML  SOLN
FINDINGS: Lower chest: No acute abnormality.

Hepatobiliary: Stable evidence of hepatic steatosis. Normal
gallbladder and bile ducts.

Pancreas: Unremarkable. No pancreatic ductal dilatation or
surrounding inflammatory changes.

Spleen: Normal in size without focal abnormality.

Adrenals/Urinary Tract: Adrenal glands are unremarkable. Kidneys are
normal, without renal calculi, focal lesion, or hydronephrosis.
Bladder is unremarkable.

Stomach/Bowel: Bowel shows no evidence of obstruction, ileus,
inflammation or lesion. The appendix is normal. No free
intraperitoneal air.

Vascular/Lymphatic: Atherosclerosis of the abdominal aorta without
aneurysm. No lymphadenopathy identified.

Reproductive: Status post hysterectomy. No adnexal masses.

Other: Prior abdominal wall surgery. No abdominal wall hernia. No
abdominopelvic ascites.

Musculoskeletal: No acute or significant osseous findings.
IMPRESSION: 1. No acute findings in the abdomen or pelvis.
2. Stable hepatic steatosis.
3. Atherosclerosis of the abdominal aorta without aneurysm.

## 2024-10-27 ENCOUNTER — Other Ambulatory Visit: Payer: Self-pay
